# Patient Record
Sex: Female | Born: 1965 | Race: White | Hispanic: Yes | Marital: Married | Smoking: Never smoker
Health system: Southern US, Community
[De-identification: ages and names within clinical notes are randomized; demographics above are authoritative.]

## PROBLEM LIST (undated history)

## (undated) DIAGNOSIS — M1990 Unspecified osteoarthritis, unspecified site: Secondary | ICD-10-CM

## (undated) DIAGNOSIS — M069 Rheumatoid arthritis, unspecified: Secondary | ICD-10-CM

## (undated) HISTORY — DX: Unspecified osteoarthritis, unspecified site: M19.90

## (undated) HISTORY — DX: Rheumatoid arthritis, unspecified: M06.9

## (undated) HISTORY — PX: ILEOSTOMY: SHX1783

---

## 2005-01-07 HISTORY — PX: CHOLECYSTECTOMY: SHX55

## 2012-08-09 HISTORY — PX: BOWEL RESECTION: SHX1257

## 2016-02-15 DIAGNOSIS — M069 Rheumatoid arthritis, unspecified: Secondary | ICD-10-CM | POA: Insufficient documentation

## 2016-02-15 DIAGNOSIS — R5381 Other malaise: Secondary | ICD-10-CM | POA: Insufficient documentation

## 2016-02-15 DIAGNOSIS — D72829 Elevated white blood cell count, unspecified: Secondary | ICD-10-CM | POA: Insufficient documentation

## 2016-02-15 DIAGNOSIS — K5790 Diverticulosis of intestine, part unspecified, without perforation or abscess without bleeding: Secondary | ICD-10-CM | POA: Insufficient documentation

## 2016-09-21 DIAGNOSIS — M1711 Unilateral primary osteoarthritis, right knee: Secondary | ICD-10-CM | POA: Insufficient documentation

## 2018-01-17 DIAGNOSIS — K649 Unspecified hemorrhoids: Secondary | ICD-10-CM | POA: Insufficient documentation

## 2018-01-17 DIAGNOSIS — Z6841 Body Mass Index (BMI) 40.0 and over, adult: Secondary | ICD-10-CM | POA: Insufficient documentation

## 2021-03-10 DIAGNOSIS — Z1211 Encounter for screening for malignant neoplasm of colon: Secondary | ICD-10-CM | POA: Insufficient documentation

## 2021-03-31 NOTE — Progress Notes (Signed)
Office Visit Note  Patient: Laura Barrett             Date of Birth: 1966-03-24           MRN: [id number]             PCP: Krystal Clark, NP Referring: Rhea Bleacher* Visit Date: 04/08/2021 Occupation: @GUAROCC @  Subjective:  Pain in multiple joints   History of Present Illness: Laura Barrett is a 55 y.o. female seen in consultation per request of her PCP.  According to the patient her symptoms a started in December 2011 with pain in her feet.  She states the pain gradually moved to almost all of her joints and her joints were swollen.  She states she was seen by her PCP and her rheumatoid factor was positive.  She was referred to Dr. January 2012.  Dr. Cardell Peach gave her a steroid shot which helped her a lot and placed her on methotrexate.  She states after that she started seeing Dr. Cardell Peach.  Over the years Dr. Dierdre Forth increased her dose of methotrexate to 8 tablets p.o. weekly.  She states in October 2021 she was experiencing increased discomfort in her knee joints and in her hands.  Dr. November 2021 added Humira to methotrexate.  She states she took the combination for about 5-1/2 months and did not notice any improvement.  Humira was discontinued in March 2022.  She continues to have pain and discomfort in her bilateral hands she has noticed more knots on her PIP and DIP joints palpation.  She is also experiencing pain and discomfort in her knee joints.  Been seeing Dr. April 2022 for her osteoarthritis in her knees for the last few years.  She has had cortisone injections in the last worth the Visco supplement injections in July 2021.  She states that after her Synvisc injection to her knee joints she had reaction and her left knee joint stayed swollen for a while.  She also went for a second opinion to [facility].  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  She continues to have some discomfort in her knee joints.  She states in May 1 day after sun exposure she  developed a rash in her neck and on her arms and legs.  She states the rash faded away after that.  Although she has had 2 recurrences of the rash since then.  Her sister has rheumatoid arthritis.  She is gravida 0, para 0.   Activities of Daily Living:  Patient reports morning stiffness for 2-3 hours.   Patient Reports nocturnal pain.  Difficulty dressing/grooming: Reports Difficulty climbing stairs: Reports Difficulty getting out of chair: Reports Difficulty using hands for taps, buttons, cutlery, and/or writing: Reports  Review of Systems  Constitutional:  Positive for fatigue.  HENT:  Negative for mouth sores, mouth dryness and nose dryness.   Eyes:  Positive for itching and dryness. Negative for pain.  Respiratory:  Negative for shortness of breath and difficulty breathing.   Cardiovascular:  Negative for chest pain and palpitations.  Gastrointestinal:  Negative for blood in stool, constipation and diarrhea.  Endocrine: Positive for increased urination.  Genitourinary:  Negative for difficulty urinating.  Musculoskeletal:  Positive for joint pain, joint pain, joint swelling and morning stiffness. Negative for myalgias, muscle tenderness and myalgias.  Skin:  Positive for rash. Negative for color change and sensitivity to sunlight.  Allergic/Immunologic: Negative for susceptible to infections.  Neurological:  Positive for numbness. Negative for dizziness,  headaches, memory loss and weakness.  Hematological:  Positive for bruising/bleeding tendency. Negative for swollen glands.  Psychiatric/Behavioral:  Negative for confusion.    PMFS History:  Patient Active Problem List   Diagnosis Date Noted   Urge incontinence 04/08/2021   Primary insomnia 04/08/2021   Prediabetes 04/08/2021   Acquired hypothyroidism 04/08/2021   History of diverticulosis 04/08/2021   Essential hypertension 04/08/2021   Primary osteoarthritis of both knees 04/08/2021   High risk medication use 04/08/2021     History reviewed. No pertinent past medical history.  Family History  Problem Relation Age of Onset   Hypertension Mother    Stroke Mother    Hypertension Father    Rheum arthritis Sister    Past Surgical History:  Procedure Laterality Date   BOWEL RESECTION  08/2012   CHOLECYSTECTOMY  01/2005   ILEOSTOMY     & reversal   Social History   Social History Narrative   Not on file   Immunization History  Administered Date(s) Administered   PFIZER(Purple Top)SARS-COV-2 Vaccination 12/29/2019, 01/26/2020, 06/25/2020, 02/04/2021     Objective: Vital Signs: BP (!) 136/97 (BP Location: Left Arm, Patient Position: Sitting, Cuff Size: Normal)   Pulse 78   Resp 16   Ht 5\' 2"  (1.575 m)   Wt 231 lb 6.4 oz (105 kg)   LMP  (LMP Unknown)   BMI 42.32 kg/m    Physical Exam Vitals and nursing note reviewed.  Constitutional:      Appearance: She is well-developed.  HENT:     Head: Normocephalic and atraumatic.  Eyes:     Conjunctiva/sclera: Conjunctivae normal.  Cardiovascular:     Rate and Rhythm: Normal rate and regular rhythm.     Heart sounds: Normal heart sounds.  Pulmonary:     Effort: Pulmonary effort is normal.     Breath sounds: Normal breath sounds.  Abdominal:     General: Bowel sounds are normal.     Palpations: Abdomen is soft.  Musculoskeletal:     Cervical back: Normal range of motion.  Lymphadenopathy:     Cervical: No cervical adenopathy.  Skin:    General: Skin is warm and dry.     Capillary Refill: Capillary refill takes less than 2 seconds.  Neurological:     Mental Status: She is alert and oriented to person, place, and time.  Psychiatric:        Behavior: Behavior normal.     Musculoskeletal Exam: C-spine was in good range of motion.  Shoulder joints, elbow joints, wrist joints, MCPs PIPs and DIPs with good range of motion.  She had tenderness over right lateral epicondyle region consistent with lateral epicondylitis.  Hip joints, knee joints,  ankles, MTPs PIPs and DIPs with good range of motion with no synovitis.  CDAI Exam: CDAI Score: 4.5  Patient Global: 4 mm; Provider Global: 1 mm Swollen: 0 ; Tender: 4  Joint Exam 04/08/2021      Right  Left  PIP 3   Tender     PIP 4   Tender     Knee   Tender   Tender     Investigation: No additional findings.  Imaging: XR Foot 2 Views Left  Result Date: 04/08/2021 PIP and DIP narrowing was noted.  No MTP, intertarsal or tibiotalar joint space narrowing was noted.  Inferior calcaneal spur was noted. Impression: These findings are consistent with osteoarthritis of the foot.  XR Foot 2 Views Right  Result Date: 04/08/2021 No MTP, PIP  or DIP narrowing was noted.  No intertarsal, tibiotalar or subtalar joint space narrowing was noted.  Inferior calcaneal spur was noted. Impression: These findings are consistent with mild osteoarthritis of the foot.  XR Hand 2 View Left  Result Date: 04/08/2021 No MCP, PIP or DIP narrowing was noted.  No CMC, intercarpal or radiocarpal joint space narrowing was noted.  No erosive changes were noted. Impression: Unremarkable x-ray of the hand.  XR Hand 2 View Right  Result Date: 04/08/2021 No MCP, PIP or DIP narrowing was noted.  No CMC, intercarpal or radiocarpal joint space narrowing was noted.  No erosive changes were noted. Impression: Unremarkable x-ray of the hand.  XR KNEE 3 VIEW LEFT  Result Date: 04/08/2021 No medial or lateral compartment narrowing was noted.  No patellofemoral narrowing was noted.  No chondrocalcinosis was noted. Impression: Unremarkable x-ray of the knee joint.  XR KNEE 3 VIEW RIGHT  Result Date: 04/08/2021 Mild medial compartment narrowing was noted.  Mild patellofemoral narrowing was noted.  No chondrocalcinosis was noted. Impression: These findings are consistent with mild osteoarthritis and mild chondromalacia patella.   Recent Labs: No results found for: WBC, HGB, PLT, NA, K, CL, CO2, GLUCOSE, BUN, CREATININE,  BILITOT, ALKPHOS, AST, ALT, PROT, ALBUMIN, CALCIUM, GFRAA, QFTBGOLD, QFTBGOLDPLUS  March 10, 2021 uric acid 4.7, CMP normal, TSH normal, LDL 84, CBC normal, hemoglobin A1c 6.0   Speciality Comments: No specialty comments available.  Procedures:  No procedures performed Allergies: Ramipril   Assessment / Plan:     Visit Diagnoses: Rheumatoid arthritis with rheumatoid factor of multiple sites without organ or systems involvement [facility]) -patient was diagnosed with rheumatoid arthritis in 2011 by Dr. Cardell Peach.  She was under care of Dr. Dierdre Forth after that until recently.  She has been on methotrexate 8 tablets p.o. weekly for many years.  She was given Humira for 5 months and was discontinued in March as she did not notice any improvement on the medication.  She continues to have some pain and discomfort in her joints.  No synovitis was noted on the examination today.  I will obtain labs and x-rays today.  Plan: Sedimentation rate, Rheumatoid factor, Cyclic citrul peptide antibody, IgG, ANA  High risk medication use - Methotrexate 8 tablets by mouth once weekly for and folic acid 1 mg 2 tablets p.o. daily.  She had recent CBC and CMP which was reviewed.  I will obtain additional labs today.- Plan: DG Chest 2 View, CBC with Differential/Platelet, COMPLETE METABOLIC PANEL WITH GFR, Hepatitis B core antibody, IgM, Hepatitis B surface antigen, Hepatitis C antibody, QuantiFERON-TB Gold Plus, Serum protein electrophoresis with reflex, IgG, IgA, IgM, HIV Antibody (routine testing w rflx).  She has been advised to stop methotrexate if she develops an infection and restart methotrexate once the infection resolves.  She is fully vaccinated against COVID-19.  Recommendation other immunizations were also placed in the AVS.  Pain in both hands -she complains of pain and discomfort in her bilateral hands.  No synovitis was noted.  I gave her a handout on hand exercises.  I believe some of the discomfort is coming from  underlying osteoarthritis.  Plan: XR Hand 2 View Right, XR Hand 2 View Left.  X-ray of bilateral hands were unremarkable.  Lateral epicondylitis, right elbow-she has symptoms of right lateral epicondylitis.  Have given her a handout on exercises.  Use of topical Voltaren gel was also discussed.  A prescription for tennis elbow brace was given.  Chronic pain of both  knees -she complains of pain and discomfort in her bilateral knee joints.  She has been under care of Dr. Dion SaucierLandau.  She has had cortisone and Visco supplement injections in the past.  She believes she had a reaction to Synvisc injection in the past.  No warmth swelling or effusion was noted on the examination today.  Have given her a handout on knee joint exercises.  Plan: XR KNEE 3 VIEW RIGHT, XR KNEE 3 VIEW LEFT.  Primary osteoarthritis of both knees  Essential hypertension-blood pressure was elevated today.  Have advised her to monitor blood pressure closely and follow-up with her PCP.  Increased risk of heart disease with rheumatoid arthritis was emphasized.  A handout was placed in the AVS.  Pain in both feet -she complains of discomfort in her feet.  No synovitis was noted.  Plan: XR Foot 2 Views Right, XR Foot 2 Views Left x-ray showed early osteoarthritic changes and bilateral calcaneal spurs.  Other medical problems are listed as follows:  History of diverticulosis  Acquired hypothyroidism  Prediabetes  Primary insomnia  Urge incontinence  Family history of rheumatoid arthritis - Sister  Other fatigue - Plan: CK  Orders: Orders Placed This Encounter  Procedures   XR Hand 2 View Right   XR Hand 2 View Left   XR KNEE 3 VIEW RIGHT   XR KNEE 3 VIEW LEFT   XR Foot 2 Views Right   XR Foot 2 Views Left   DG Chest 2 View   CBC with Differential/Platelet   COMPLETE METABOLIC PANEL WITH GFR   Sedimentation rate   CK   Rheumatoid factor   Cyclic citrul peptide antibody, IgG   ANA   Hepatitis B core antibody, IgM    Hepatitis B surface antigen   Hepatitis C antibody   QuantiFERON-TB Gold Plus   Serum protein electrophoresis with reflex   IgG, IgA, IgM   HIV Antibody (routine testing w rflx)    No orders of the defined types were placed in this encounter.    Follow-Up Instructions: Return for Rheumatoid arthritis, Osteoarthritis.   Pollyann SavoyShaili Lilac Hoff, MD  Note - This record has been created using [employer] software.  Chart creation errors have been sought, but may not always  have been located. Such creation errors do not reflect on  the standard of medical care.

## 2021-04-08 ENCOUNTER — Ambulatory Visit: Payer: Self-pay

## 2021-04-08 ENCOUNTER — Encounter: Payer: Self-pay | Admitting: Rheumatology

## 2021-04-08 ENCOUNTER — Ambulatory Visit (INDEPENDENT_AMBULATORY_CARE_PROVIDER_SITE_OTHER): Payer: Managed Care, Other (non HMO) | Admitting: Rheumatology

## 2021-04-08 ENCOUNTER — Telehealth: Payer: Self-pay

## 2021-04-08 ENCOUNTER — Ambulatory Visit (HOSPITAL_COMMUNITY)
Admission: RE | Admit: 2021-04-08 | Discharge: 2021-04-08 | Disposition: A | Discharge status: Home / Self Care | Payer: Managed Care, Other (non HMO) | Source: Ambulatory Visit | Attending: Rheumatology | Admitting: Rheumatology

## 2021-04-08 ENCOUNTER — Other Ambulatory Visit: Payer: Self-pay

## 2021-04-08 ENCOUNTER — Encounter (HOSPITAL_COMMUNITY): Payer: Self-pay

## 2021-04-08 VITALS — BP 136/97 | HR 78 | Resp 16 | Ht 62.0 in | Wt 231.4 lb

## 2021-04-08 DIAGNOSIS — M79671 Pain in right foot: Secondary | ICD-10-CM

## 2021-04-08 DIAGNOSIS — I10 Essential (primary) hypertension: Secondary | ICD-10-CM

## 2021-04-08 DIAGNOSIS — R7303 Prediabetes: Secondary | ICD-10-CM

## 2021-04-08 DIAGNOSIS — M25561 Pain in right knee: Secondary | ICD-10-CM | POA: Diagnosis not present

## 2021-04-08 DIAGNOSIS — M25562 Pain in left knee: Secondary | ICD-10-CM

## 2021-04-08 DIAGNOSIS — R5383 Other fatigue: Secondary | ICD-10-CM

## 2021-04-08 DIAGNOSIS — M79641 Pain in right hand: Secondary | ICD-10-CM | POA: Diagnosis not present

## 2021-04-08 DIAGNOSIS — Z8261 Family history of arthritis: Secondary | ICD-10-CM

## 2021-04-08 DIAGNOSIS — M0579 Rheumatoid arthritis with rheumatoid factor of multiple sites without organ or systems involvement: Secondary | ICD-10-CM | POA: Diagnosis not present

## 2021-04-08 DIAGNOSIS — G8929 Other chronic pain: Secondary | ICD-10-CM | POA: Diagnosis not present

## 2021-04-08 DIAGNOSIS — E039 Hypothyroidism, unspecified: Secondary | ICD-10-CM | POA: Insufficient documentation

## 2021-04-08 DIAGNOSIS — Z8719 Personal history of other diseases of the digestive system: Secondary | ICD-10-CM

## 2021-04-08 DIAGNOSIS — F5101 Primary insomnia: Secondary | ICD-10-CM | POA: Insufficient documentation

## 2021-04-08 DIAGNOSIS — M79642 Pain in left hand: Secondary | ICD-10-CM | POA: Diagnosis not present

## 2021-04-08 DIAGNOSIS — Z79899 Other long term (current) drug therapy: Secondary | ICD-10-CM | POA: Diagnosis not present

## 2021-04-08 DIAGNOSIS — M1711 Unilateral primary osteoarthritis, right knee: Secondary | ICD-10-CM

## 2021-04-08 DIAGNOSIS — M79672 Pain in left foot: Secondary | ICD-10-CM

## 2021-04-08 DIAGNOSIS — N3941 Urge incontinence: Secondary | ICD-10-CM

## 2021-04-08 DIAGNOSIS — M7711 Lateral epicondylitis, right elbow: Secondary | ICD-10-CM | POA: Diagnosis not present

## 2021-04-08 DIAGNOSIS — M170 Bilateral primary osteoarthritis of knee: Secondary | ICD-10-CM

## 2021-04-08 NOTE — Patient Instructions (Addendum)
Methotrexate Tablets What is this medication? METHOTREXATE (METH oh TREX ate) treats inflammatory conditions such as arthritis and psoriasis. It works by decreasing inflammation, which can reduce pain and prevent long-term injury to the joints and skin. It may also be used to treat some types of cancer. It works by slowing down the growth of cancercells. This medicine may be used for other purposes; ask your health care provider orpharmacist if you have questions. COMMON BRAND NAME(S): Rheumatrex, Trexall What should I tell my care team before I take this medication? They need to know if you have any of these conditions: Fluid in the stomach area or lungs If you often drink alcohol Infection or immune system problems Kidney disease or on hemodialysis Liver disease Low blood counts, like low white cell, platelet, or red cell counts Lung disease Radiation therapy Stomach ulcers Ulcerative colitis An unusual or allergic reaction to methotrexate, other medications, foods, dyes, or preservatives Pregnant or trying to get pregnant Breast-feeding How should I use this medication? Take this medication by mouth with a glass of water. Follow the directions on the prescription label. Take your medication at regular intervals. Do not take it more often than directed. Do not stop taking except on your care team'sadvice. Make sure you know why you are taking this medication and how often you should take it. If this medication is used for a condition that is not cancer, like arthritis or psoriasis, it should be taken weekly, NOT daily. Taking thismedication more often than directed can cause serious side effects, even death. Talk to your care team about safe handling and disposal of this medication. Youmay need to take special precautions. Talk to your care team about the use of this medication in children. While thismedication may be prescribed for selected conditions, precautions do apply. Overdosage: If  you think you have taken too much of this medicine contact apoison control center or emergency room at once. NOTE: This medicine is only for you. Do not share this medicine with others. What if I miss a dose? If you miss a dose, talk with your care team. Do not take double or extra doses. What may interact with this medication? Do not take this medication with any of the following: Acitretin This medication may also interact with the following: Aspirin and aspirin-like medications including salicylates Azathioprine Certain antibiotics like penicillins, tetracycline, and chloramphenicol Certain medications that treat or prevent blood clots like warfarin, apixaban, dabigatran, and rivaroxaban Certain medications for stomach problems like esomeprazole, omeprazole, pantoprazole Cyclosporine Dapsone Diuretics Gold Hydroxychloroquine Live virus vaccines Medications for infection like acyclovir, adefovir, amphotericin B, bacitracin, cidofovir, foscarnet, ganciclovir, gentamicin, pentamidine, vancomycin Mercaptopurine NSAIDs, medications for pain and inflammation, like ibuprofen or naproxen Other cytotoxic agents Pamidronate Pemetrexed Penicillamine Phenylbutazone Phenytoin Probenecid Pyrimethamine Retinoids such as isotretinoin and tretinoin Steroid medications like prednisone or cortisone Sulfonamides like sulfasalazine and trimethoprim/sulfamethoxazole Theophylline Zoledronic acid This list may not describe all possible interactions. Give your health care provider a list of all the medicines, herbs, non-prescription drugs, or dietary supplements you use. Also tell them if you smoke, drink alcohol, or use illegaldrugs. Some items may interact with your medicine. What should I watch for while using this medication? Avoid alcoholic drinks. This medication can make you more sensitive to the sun. Keep out of the sun. If you cannot avoid being in the sun, wear protective clothing and use  sunscreen.Do not use sun lamps or tanning beds/booths. You may need blood work done while you are taking this medication.  Call your care team for advice if you get a fever, chills or sore throat, or other symptoms of a cold or flu. Do not treat yourself. This medication decreases your body's ability to fight infections. Try to avoid being aroundpeople who are sick. This medication may increase your risk to bruise or bleed. Call your care teamif you notice any unusual bleeding. Be careful brushing or flossing your teeth or using a toothpick because you may get an infection or bleed more easily. If you have any dental work done, Estate agent you are receiving this medication. Check with your care team if you get an attack of severe diarrhea, nausea and vomiting, or if you sweat a lot. The loss of too much body fluid can make itdangerous for you to take this medication. Talk to your care team about your risk of cancer. You may be more at risk forcertain types of cancers if you take this medication. Do not become pregnant while taking this medication or for 6 months after stopping it. Women should inform their care team if they wish to become pregnant or think they might be pregnant. Men should not father a child while taking this medication and for 3 months after stopping it. There is potential for serious harm to an unborn child. Talk to your care team for more information. Do not breast-feed an infant while taking this medication or for 1week after stopping it. This medication may make it more difficult to get pregnant or father a child.Talk to your care team if you are concerned about your fertility. What side effects may I notice from receiving this medication? Side effects that you should report to your care team as soon as possible: Allergic reactions-skin rash, itching, hives, swelling of the face, lips, tongue, or throat Blood clot-pain, swelling, or warmth in the leg, shortness of breath, chest  pain Dry cough, shortness of breath or trouble breathing Infection-fever, chills, cough, sore throat, wounds that don't heal, pain or trouble when passing urine, general feeling of discomfort or being unwell Kidney injury-decrease in the amount of urine, swelling of the ankles, hands, or feet Liver injury-right upper belly pain, loss of appetite, nausea, light-colored stool, dark yellow or brown urine, yellowing of the skin or eyes, unusual weakness or fatigue Low red blood cell count-unusual weakness or fatigue, dizziness, headache, trouble breathing Redness, blistering, peeling, or loosening of the skin, including inside the mouth Seizures Unusual bruising or bleeding Side effects that usually do not require medical attention (report to your careteam if they continue or are bothersome): Diarrhea Dizziness Hair loss Nausea Pain, redness, or swelling with sores inside the mouth or throat Vomiting This list may not describe all possible side effects. Call your doctor for medical advice about side effects. You may report side effects to FDA at1-800-FDA-1088. Where should I keep my medication? Keep out of the reach of children and pets. Store at room temperature between 20 and 25 degrees C (68 and 77 degrees F).Protect from light. Get rid of any unused medication after the expiration date. Talk to your care team about how to dispose of unused medication. Specialdirections may apply. NOTE: This sheet is a summary. It may not cover all possible information. If you have questions about this medicine, talk to your doctor, pharmacist, orhealth care provider.  2022 Elsevier/Gold Standard (2020-11-01 15:55:45)   Standing Labs We placed an order today for your standing lab work.   Please have your standing labs drawn in September and every 3 months  If possible, please have your labs drawn 2 weeks prior to your appointment so that the provider can discuss your results at your  appointment.  Please note that you may see your imaging and lab results in MyChart before we have reviewed them. We may be awaiting multiple results to interpret others before contacting you. Please allow our office up to 72 hours to thoroughly review all of the results before contacting the office for clarification of your results.  We have open lab daily: Monday through Thursday from 1:30-4:30 PM and Friday from 1:30-4:00 PM at the office of Dr. Pollyann Savoy, [facility].   Please be advised, all patients with office appointments requiring lab work will take precedent over walk-in lab work.  If possible, please come for your lab work on Monday and Friday afternoons, as you may experience shorter wait times. The office is located at [address], [place], [place] [address] No appointment is necessary.   Labs are drawn by [facility]. Please bring your co-pay at the time of your lab draw.  You may receive a bill from [facility] for your lab work. Vaccines You are taking a medication(s) that can suppress your immune system.  The following immunizations are recommended: Flu annually Covid-19  Td/Tdap (tetanus, diphtheria, pertussis) every 10 years Pneumonia (Prevnar 15 then Pneumovax 23 at least 1 year apart.  Alternatively, can take Prevnar 20 without needing additional dose) Shingrix (after age 106): 2 doses from 4 weeks to 6 months apart  Please check with your PCP to make sure you are up to date.  If you test POSITIVE for COVID19 and have MILD to MODERATE symptoms: First, call your PCP if you would like to receive COVID19 treatment AND Hold your medications during the infection and for at least 1 week after your symptoms have resolved: Injectable medication (Benlysta, Cimzia, Cosentyx, Enbrel, Humira, Orencia, Remicade, Simponi, Stelara, Taltz, Tremfya) Methotrexate Leflunomide (Arava) Mycophenolate (Cellcept) Harriette Ohara, Olumiant, or Rinvoq If you take Actemra or  Kevzara, you DO NOT need to hold these for COVID19 infection.  If you test POSITIVE for COVID19 and have NO symptoms: First, call your PCP if you would like to receive COVID19 treatment AND Hold your medications for at least 10 days after the day that you tested positive Injectable medication (Benlysta, Cimzia, Cosentyx, Enbrel, Humira, Orencia, Remicade, Simponi, Stelara, Taltz, Tremfya) Methotrexate Leflunomide (Arava) Mycophenolate (Cellcept) Harriette Ohara, Olumiant, or Rinvoq If you take Actemra or Kevzara, you DO NOT need to hold these for COVID19 infection.  If you have signs or symptoms of an infection or start antibiotics: First, call your PCP for workup of your infection. Hold your medication through the infection, until you complete your antibiotics, and until symptoms resolve if you take the following: Injectable medication (Actemra, Benlysta, Cimzia, Cosentyx, Enbrel, Humira, Kevzara, Orencia, Remicade, Simponi, Stelara, Taltz, Tremfya) Methotrexate Leflunomide (Arava) Mycophenolate (Cellcept) Harriette Ohara, Olumiant, or Rinvoq  Heart Disease Prevention   Your inflammatory disease increases your risk of heart disease which includes heart attack, stroke, atrial fibrillation (irregular heartbeats), high blood pressure, heart failure and atherosclerosis (plaque in the arteries).  It is important to reduce your risk by:   Keep blood pressure, cholesterol, and blood sugar at healthy levels   Smoking Cessation   Maintain a healthy weight  BMI 20-25   Eat a healthy diet  Plenty of fresh fruit, vegetables, and whole grains  Limit saturated fats, foods high in sodium, and added sugars  DASH and Mediterranean diet   Increase  physical activity  Recommend moderate physically activity for 150 minutes per week/ 30 minutes a day for five days a week These can be broken up into three separate ten-minute sessions during the day.   Reduce Stress  Meditation, slow breathing exercises, yoga,  coloring books  Dental visits twice a year   Journal for Nurse Practitioners, 15(4), [phone]. Retrieved July 15, 2018 from http://clinicalkey.com/nursing">  Knee Exercises Ask your health care provider which exercises are safe for you. Do exercises exactly as told by your health care provider and adjust them as directed. It is normal to feel mild stretching, pulling, tightness, or discomfort as you do these exercises. Stop right away if you feel sudden pain or your pain gets worse. Do not begin these exercises until told by your health care provider. Stretching and range-of-motion exercises These exercises warm up your muscles and joints and improve the movement and flexibility of your knee. These exercises also help to relieve pain andswelling. Knee extension, prone Lie on your abdomen (prone position) on a bed. Place your left / right knee just beyond the edge of the surface so your knee is not on the bed. You can put a towel under your left / right thigh just above your kneecap for comfort. Relax your leg muscles and allow gravity to straighten your knee (extension). You should feel a stretch behind your left / right knee. Hold this position for __________ seconds. Scoot up so your knee is supported between repetitions. Repeat __________ times. Complete this exercise __________ times a day. Knee flexion, active  Lie on your back with both legs straight. If this causes back discomfort, bend your left / right knee so your foot is flat on the floor. Slowly slide your left / right heel back toward your buttocks. Stop when you feel a gentle stretch in the front of your knee or thigh (flexion). Hold this position for __________ seconds. Slowly slide your left / right heel back to the starting position. Repeat __________ times. Complete this exercise __________ times a day. Quadriceps stretch, prone  Lie on your abdomen on a firm surface, such as a bed or padded floor. Bend your left / right  knee and hold your ankle. If you cannot reach your ankle or pant leg, loop a belt around your foot and grab the belt instead. Gently pull your heel toward your buttocks. Your knee should not slide out to the side. You should feel a stretch in the front of your thigh and knee (quadriceps). Hold this position for __________ seconds. Repeat __________ times. Complete this exercise __________ times a day. Hamstring, supine Lie on your back (supine position). Loop a belt or towel over the ball of your left / right foot. The ball of your foot is on the walking surface, right under your toes. Straighten your left / right knee and slowly pull on the belt to raise your leg until you feel a gentle stretch behind your knee (hamstring). Do not let your knee bend while you do this. Keep your other leg flat on the floor. Hold this position for __________ seconds. Repeat __________ times. Complete this exercise __________ times a day. Strengthening exercises These exercises build strength and endurance in your knee. Endurance is theability to use your muscles for a long time, even after they get tired. Quadriceps, isometric This exercise stretches the muscles in front of your thigh (quadriceps) without moving your knee joint (isometric). Lie on your back with your left / right leg extended and  your other knee bent. Put a rolled towel or small pillow under your knee if told by your health care provider. Slowly tense the muscles in the front of your left / right thigh. You should see your kneecap slide up toward your hip or see increased dimpling just above the knee. This motion will push the back of the knee toward the floor. For __________ seconds, hold the muscle as tight as you can without increasing your pain. Relax the muscles slowly and completely. Repeat __________ times. Complete this exercise __________ times a day. Straight leg raises This exercise stretches the muscles in front of your thigh  (quadriceps) and the muscles that move your hips (hip flexors). Lie on your back with your left / right leg extended and your other knee bent. Tense the muscles in the front of your left / right thigh. You should see your kneecap slide up or see increased dimpling just above the knee. Your thigh may even shake a bit. Keep these muscles tight as you raise your leg 4-6 inches (10-15 cm) off the floor. Do not let your knee bend. Hold this position for __________ seconds. Keep these muscles tense as you lower your leg. Relax your muscles slowly and completely after each repetition. Repeat __________ times. Complete this exercise __________ times a day. Hamstring, isometric Lie on your back on a firm surface. Bend your left / right knee about __________ degrees. Dig your left / right heel into the surface as if you are trying to pull it toward your buttocks. Tighten the muscles in the back of your thighs (hamstring) to "dig" as hard as you can without increasing any pain. Hold this position for __________ seconds. Release the tension gradually and allow your muscles to relax completely for __________ seconds after each repetition. Repeat __________ times. Complete this exercise __________ times a day. Hamstring curls If told by your health care provider, do this exercise while wearing ankle weights. Begin with __________ lb weights. Then increase the weight by 1 lb (0.5 kg) increments. Do not wear ankle weights that are more than __________ lb. Lie on your abdomen with your legs straight. Bend your left / right knee as far as you can without feeling pain. Keep your hips flat against the floor. Hold this position for __________ seconds. Slowly lower your leg to the starting position. Repeat __________ times. Complete this exercise __________ times a day. Squats This exercise strengthens the muscles in front of your thigh and knee (quadriceps). Stand in front of a table, with your feet and knees  pointing straight ahead. You may rest your hands on the table for balance but not for support. Slowly bend your knees and lower your hips like you are going to sit in a chair. Keep your weight over your heels, not over your toes. Keep your lower legs upright so they are parallel with the table legs. Do not let your hips go lower than your knees. Do not bend lower than told by your health care provider. If your knee pain increases, do not bend as low. Hold the squat position for __________ seconds. Slowly push with your legs to return to standing. Do not use your hands to pull yourself to standing. Repeat __________ times. Complete this exercise __________ times a day. Wall slides This exercise strengthens the muscles in front of your thigh and knee (quadriceps). Lean your back against a smooth wall or door, and walk your feet out 18-24 inches (46-61 cm) from it. Place your  feet hip-width apart. Slowly slide down the wall or door until your knees bend __________ degrees. Keep your knees over your heels, not over your toes. Keep your knees in line with your hips. Hold this position for __________ seconds. Repeat __________ times. Complete this exercise __________ times a day. Straight leg raises This exercise strengthens the muscles that rotate the leg at the hip and move it away from your body (hip abductors). Lie on your side with your left / right leg in the top position. Lie so your head, shoulder, knee, and hip line up. You may bend your bottom knee to help you keep your balance. Roll your hips slightly forward so your hips are stacked directly over each other and your left / right knee is facing forward. Leading with your heel, lift your top leg 4-6 inches (10-15 cm). You should feel the muscles in your outer hip lifting. Do not let your foot drift forward. Do not let your knee roll toward the ceiling. Hold this position for __________ seconds. Slowly return your leg to the starting  position. Let your muscles relax completely after each repetition. Repeat __________ times. Complete this exercise __________ times a day. Straight leg raises This exercise stretches the muscles that move your hips away from the front of the pelvis (hip extensors). Lie on your abdomen on a firm surface. You can put a pillow under your hips if that is more comfortable. Tense the muscles in your buttocks and lift your left / right leg about 4-6 inches (10-15 cm). Keep your knee straight as you lift your leg. Hold this position for __________ seconds. Slowly lower your leg to the starting position. Let your leg relax completely after each repetition. Repeat __________ times. Complete this exercise __________ times a day. This information is not intended to replace advice given to you by your health care provider. Make sure you discuss any questions you have with your healthcare provider. Document Revised: 07/16/2018 Document Reviewed: 07/16/2018 Elsevier Patient Education  2022 [employer].   If you wish to have your labs drawn at another location, please call the office 24 hours in advance to send orders.  If you have any questions regarding directions or hours of operation,  please call [phone].   As a reminder, please drink plenty of water prior to coming for your lab work. Thanks!  Elbow and Forearm Exercises Ask your health care provider which exercises are safe for you. Do exercises exactly as told by your health care provider and adjust them as directed. It is normal to feel mild stretching, pulling, tightness, or discomfort as you do these exercises. Stop right away if you feel sudden pain or your pain gets worse. Do not begin these exercises until told by your health care provider. Range-of-motion exercises These exercises warm up your muscles and joints and improve the movement and flexibility of your injured elbow and forearm. The exercises also help to relieve pain, numbness,  and tingling. These exercises are done using the muscles in your injured elbow and forearm (active). Elbow flexion, active Hold your left / right arm at your side, and bend your elbow (flexion) as far as you can using only your arm muscles. Hold this position for __________ seconds. Slowly return to the starting position. Repeat __________ times. Complete this exercise __________ times a day. Elbow extension, active Hold your left / right arm at your side, and straighten your elbow (extension) as much as you can using only your arm muscles. Hold  this position for __________ seconds. Slowly return to the starting position. Repeat __________ times. Complete this exercise __________ times a day. Active forearm rotation, supination This is an exercise in which you turn (rotate) your forearm palm up (supination). Stand or sit with your elbows at your sides. Bend your left / right elbow to a 90-degree angle (right angle). Rotate your palm up until you feel a gentle stretch on the inside of your forearm. Hold this position for __________ seconds. Slowly return to the starting position. Repeat __________ times. Complete this exercise __________ times a day. Active forearm rotation, pronation This is an exercise in which you turn (rotate) your forearm palm down (pronation). Stand or sit with your elbows at your sides. Bend your left / right elbow to a 90-degree angle (right angle). Rotate your palm down until you feel a gentle stretch on the top of your forearm. Hold this position for __________ seconds. Slowly return to the starting position. Repeat __________ times. Complete this exercise __________ times a day. Stretching exercises These exercises warm up your muscles and joints and improve the movement and flexibility of your injured elbow and forearm. These exercises also help to relieve pain, numbness, and tingling. These exercises are done using your healthy elbow and forearm to help  stretch the muscles in your injured elbow and forearm (active-assisted). Elbow flexion, active-assisted  Hold your left / right arm at your side, and bend your elbow (flexion) as much as you can using your left / right arm muscles. Use your other hand to bend your left / right elbow farther. To do this, gently push up on your forearm until you feel a gentle stretch on the back of your elbow. Hold this position for __________ seconds. Slowly return to the starting position. Repeat __________ times. Complete this exercise __________ times a day. Elbow extension, active-assisted  Hold your left / right arm at your side, and straighten your elbow (extension) as much as you can using your left / right arm muscles. Use your other hand to straighten the left / right elbow farther. To do this, gently push down on your forearm until you feel a gentle stretch on the inside of your elbow. Hold this position for __________ seconds. Slowly return to the starting position. Repeat __________ times. Complete this exercise __________ times a day. Active-assisted forearm rotation, supination This is an exercise in which you turn (rotate) your forearm palm up (supination). Sit with your left / right elbow bent in a 90-degree angle (right angle) with your forearm resting on a table. Keeping your upper body and shoulder still, rotate your forearm so your palm faces upward. Use your other hand to help rotate your forearm further until you feel a gentle to moderate stretch. Hold this position for __________ seconds. Slowly release the stretch and return to the starting position. Repeat __________ times. Complete this exercise __________ times a day. Active-assisted forearm rotation, pronation This is an exercise in which you turn (rotate) your forearm palm down (pronation). Sit with your left / right elbow bent in a 90-degree angle (right angle) with your forearm resting on a table. Keeping your upper body and  shoulder still, rotate your forearm so your palm faces the tabletop. Use your other hand to help rotate your forearm further until you feel a gentle to moderate stretch. Hold this position for __________ seconds. Slowly release the stretch and return to the starting position. Repeat __________ times. Complete this exercise __________ times a day. Passive elbow  flexion, supine Lie on your back (supine position). Extend your left / right arm up in the air, bracing it with your other hand. Let your left / right hand slowly lower toward your shoulder (passive flexion), while your elbow stays pointed toward the ceiling. You should feel a gentle stretch along the back of your upper arm and elbow. If instructed by your health care provider, you may increase the intensity of your stretch by adding a small wrist weight or hand weight. Hold this position for __________ seconds. Slowly return to the starting position. Repeat __________ times. Complete this exercise __________ times a day. Passive elbow extension, supine  Lie on your back (supine position). Make sure that you are in a comfortable position that lets you relax your arm muscles. Place a folded towel under your left / right upper arm so your elbow and shoulder are at the same height. Straighten your left / right arm so your elbow does not rest on the bed or towel. Let the weight of your hand stretch your elbow (passive extension). Keep your arm and chest muscles relaxed. You should feel a stretch on the inside of your elbow. If told by your health care provider, you may increase the intensity of your stretch by adding a small wrist weight or hand weight. Hold this position for __________ seconds. Slowly release the stretch. Repeat __________ times. Complete this exercise __________ times a day. Strengthening exercises These exercises build strength and endurance in your elbow and forearm. Endurance is the ability to use your muscles for a  long time, even after theyget tired. Elbow flexion, isometric  Stand or sit up straight. Bend your left / right elbow in a 90-degree angle (right angle), and keep your forearm at the height of your waist. Your thumb should be pointed toward the ceiling (neutral forearm). Place your other hand on top of your left / right forearm. Gently push down while you resist with your left / right arm (isometric flexion). Push as hard as you can with both arms without causing any pain or movement at your left / right elbow. Hold this position for __________ seconds. Slowly release the tension in both arms. Let your muscles relax completely before you repeat the exercise. Repeat __________ times. Complete this exercise __________ times a day. Elbow extension, isometric  Stand or sit up straight. Place your left / right arm so your palm faces your abdomen and is at the height of your waist. Place your other hand on the underside of your left / right forearm. Gently push up while you resist with your left / right arm (isometric extension). Push as hard as you can with both arms without causing any pain or movement at your left / right elbow. Hold this position for __________ seconds. Slowly release the tension in both arms. Let your muscles relax completely before you repeat the exercise. Repeat __________ times. Complete this exercise __________ times a day. Elbow flexion with forearm palm up  Sit on a firm chair without armrests, or stand up. Place your left / right arm at your side with your elbow straight and your palm facing forward. Holding a __________weight or gripping a rubber exercise band or tubing, bend your elbow to bring your hand toward your shoulder (flexion). Hold this position for __________ seconds. Slowly return to the starting position. Repeat __________ times. Complete this exercise __________ times a day. Elbow extension, active  Sit on a firm chair without armrests, or stand  up. Hold  a rubber exercise band or tubing in both hands. Keeping your upper arms at your sides, bring both hands up to your left / right shoulder. Keep your left / right hand just below your other hand. Straighten your left / right elbow (extension) while keeping your other arm still. Hold this position for __________ seconds. Control the resistance of the band or tubing as you return to the starting position. Repeat __________ times. Complete this exercise __________ times a day. Forearm rotation, supination  Sit with your left / right forearm supported on a table. Your elbow should be at waist height and bent at a 90-degree angle (right angle). Gently grasp a lightweight hammer. Rest your hand over the edge of the table with your palm facing down. Without moving your left / right elbow, slowly rotate your forearm to turn your palm up toward the ceiling (supination). Hold this position for __________ seconds. Slowly return to the starting position. Repeat __________ times. Complete this exercise __________ times a day. Forearm rotation, pronation  Sit with your left / right forearm supported on a table. Keep your elbow below shoulder height. Gently grasp a lightweight hammer. Rest your hand over the edge of the table with your palm facing up. Without moving your left / right elbow, slowly rotate your forearm to turn your palm down toward the floor (pronation). Hold this position for __________seconds. Slowly return to the starting position. Repeat __________ times. Complete this exercise __________ times a day. This information is not intended to replace advice given to you by your health care provider. Make sure you discuss any questions you have with your healthcare provider. Document Revised: 01/16/2019 Document Reviewed: 10/16/2018 Elsevier Patient Education  2022 [employer] Hand Exercises Hand exercises can be helpful for almost anyone. These exercises can strengthen the hands,  improve flexibility and movement, and increase blood flow to the hands. These results can make work and daily tasks easier. Hand exercises can be especially helpful for people who have joint pain from arthritis or have nerve damage from overuse (carpal tunnel syndrome). These exercises can also help people who have injured a hand. Exercises Most of these hand exercises are gentle stretching and motion exercises. It is usually safe to do them often throughout the day. Warming up your hands before exercise may help to reduce stiffness. You can do this with gentle massage orby placing your hands in warm water for 10-15 minutes. It is normal to feel some stretching, pulling, tightness, or mild discomfort as you begin new exercises. This will gradually improve. Stop an exercise right away if you feel sudden, severe pain or your pain gets worse. Ask your healthcare provider which exercises are best for you. Knuckle bend or "claw" fist Stand or sit with your arm, hand, and all five fingers pointed straight up. Make sure to keep your wrist straight during the exercise. Gently bend your fingers down toward your palm until the tips of your fingers are touching the top of your palm. Keep your big knuckle straight and just bend the small knuckles in your fingers. Hold this position for __________ seconds. Straighten (extend) your fingers back to the starting position. Repeat this exercise 5-10 times with each hand. Full finger fist Stand or sit with your arm, hand, and all five fingers pointed straight up. Make sure to keep your wrist straight during the exercise. Gently bend your fingers into your palm until the tips of your fingers are touching the middle of your palm. Hold this position  for __________ seconds. Extend your fingers back to the starting position, stretching every joint fully. Repeat this exercise 5-10 times with each hand. Straight fist Stand or sit with your arm, hand, and all five fingers  pointed straight up. Make sure to keep your wrist straight during the exercise. Gently bend your fingers at the big knuckle, where your fingers meet your hand, and the middle knuckle. Keep the knuckle at the tips of your fingers straight and try to touch the bottom of your palm. Hold this position for __________ seconds. Extend your fingers back to the starting position, stretching every joint fully. Repeat this exercise 5-10 times with each hand. Tabletop Stand or sit with your arm, hand, and all five fingers pointed straight up. Make sure to keep your wrist straight during the exercise. Gently bend your fingers at the big knuckle, where your fingers meet your hand, as far down as you can while keeping the small knuckles in your fingers straight. Think of forming a tabletop with your fingers. Hold this position for __________ seconds. Extend your fingers back to the starting position, stretching every joint fully. Repeat this exercise 5-10 times with each hand. Finger spread Place your hand flat on a table with your palm facing down. Make sure your wrist stays straight as you do this exercise. Spread your fingers and thumb apart from each other as far as you can until you feel a gentle stretch. Hold this position for __________ seconds. Bring your fingers and thumb tight together again. Hold this position for __________ seconds. Repeat this exercise 5-10 times with each hand. Making circles Stand or sit with your arm, hand, and all five fingers pointed straight up. Make sure to keep your wrist straight during the exercise. Make a circle by touching the tip of your thumb to the tip of your index finger. Hold for __________ seconds. Then open your hand wide. Repeat this motion with your thumb and each finger on your hand. Repeat this exercise 5-10 times with each hand. Thumb motion Sit with your forearm resting on a table and your wrist straight. Your thumb should be facing up toward the  ceiling. Keep your fingers relaxed as you move your thumb. Lift your thumb up as high as you can toward the ceiling. Hold for __________ seconds. Bend your thumb across your palm as far as you can, reaching the tip of your thumb for the small finger (pinkie) side of your palm. Hold for __________ seconds. Repeat this exercise 5-10 times with each hand. Grip strengthening  Hold a stress ball or other soft ball in the middle of your hand. Slowly increase the pressure, squeezing the ball as much as you can without causing pain. Think of bringing the tips of your fingers into the middle of your palm. All of your finger joints should bend when doing this exercise. Hold your squeeze for __________ seconds, then relax. Repeat this exercise 5-10 times with each hand. Contact a health care provider if: Your hand pain or discomfort gets much worse when you do an exercise. Your hand pain or discomfort does not improve within 2 hours after you exercise. If you have any of these problems, stop doing these exercises right away. Do not do them again unless your health care provider says that you can. Get help right away if: You develop sudden, severe hand pain or swelling. If this happens, stop doing these exercises right away. Do not do them again unless your health care provider says that  you can. This information is not intended to replace advice given to you by your health care provider. Make sure you discuss any questions you have with your healthcare provider. Document Revised: 01/16/2019 Document Reviewed: 09/26/2018 Elsevier Patient Education  2022 [employer].

## 2021-04-08 NOTE — Telephone Encounter (Signed)
FYI: Patient was seen as a new patient today. She has transferred care from [facility]. Patient is currently on methotrexate.  Consent obtained and sent to the scan center.

## 2021-04-11 NOTE — Progress Notes (Signed)
An old calcified nodule was noted in the right upper portion of the lung.  No comparison x-rays were available.  I will forward results to her PCP for evaluation.

## 2021-04-12 LAB — PROTEIN ELECTROPHORESIS, SERUM, WITH REFLEX
Albumin ELP: 3.7 g/dL — ABNORMAL LOW (ref 3.8–4.8)
Alpha 1: 0.3 g/dL (ref 0.2–0.3)
Alpha 2: 0.6 g/dL (ref 0.5–0.9)
Beta 2: 0.4 g/dL (ref 0.2–0.5)
Beta Globulin: 0.5 g/dL (ref 0.4–0.6)
Gamma Globulin: 1.1 g/dL (ref 0.8–1.7)
Total Protein: 6.6 g/dL (ref 6.1–8.1)

## 2021-04-12 LAB — QUANTIFERON-TB GOLD PLUS
Mitogen-NIL: >10 IU/mL
NIL: 0.02 IU/mL
QuantiFERON-TB Gold Plus: NEGATIVE
TB1-NIL: 0.02 IU/mL
TB2-NIL: 0.03 IU/mL

## 2021-04-12 LAB — IGG, IGA, IGM
IgG (Immunoglobin G), Serum: 1214 mg/dL (ref 600–1640)
IgM, Serum: 105 mg/dL (ref 50–300)
Immunoglobulin A: 273 mg/dL (ref 47–310)

## 2021-04-12 LAB — HIV ANTIBODY (ROUTINE TESTING W REFLEX): HIV 1&2 Ab, 4th Generation: NONREACTIVE

## 2021-04-12 LAB — CYCLIC CITRUL PEPTIDE ANTIBODY, IGG: Cyclic Citrullin Peptide Ab: >250 UNITS — ABNORMAL HIGH

## 2021-04-12 LAB — SEDIMENTATION RATE: Sed Rate: 6 mm/h (ref 0–30)

## 2021-04-12 LAB — HEPATITIS C ANTIBODY
Hepatitis C Ab: NONREACTIVE
SIGNAL TO CUT-OFF: 0.01 (ref <1.00)

## 2021-04-12 LAB — HEPATITIS B SURFACE ANTIGEN: Hepatitis B Surface Ag: NONREACTIVE

## 2021-04-12 LAB — ANTI-NUCLEAR AB-TITER (ANA TITER): ANA Titer 1: 1:40 {titer} — ABNORMAL HIGH

## 2021-04-12 LAB — HEPATITIS B CORE ANTIBODY, IGM: Hep B C IgM: NONREACTIVE

## 2021-04-12 LAB — RHEUMATOID FACTOR: Rheumatoid fact SerPl-aCnc: 197 IU/mL — ABNORMAL HIGH (ref <14)

## 2021-04-12 LAB — CK: Total CK: 51 U/L (ref 29–143)

## 2021-04-12 LAB — ANA: Anti Nuclear Antibody (ANA): POSITIVE — AB

## 2021-04-12 NOTE — Telephone Encounter (Signed)
Sticky note made in patient's chart 

## 2021-04-12 NOTE — Progress Notes (Signed)
Labs are consistent with rheumatoid arthritis.  I will discuss all the lab results at the follow-up visit.

## 2021-04-24 NOTE — Progress Notes (Signed)
Office Visit Note  Patient: Laura Barrett             Date of Birth: 1965-12-15           MRN: [id number]             PCP: Mayer Camel, NP Referring: Bess Harvest* Visit Date: 05/04/2021 Occupation: '@GUAROCC' @  Subjective:  Medication monitoring   History of Present Illness: Laura Barrett is a 55 y.o. female with history of rheumatoid arthritis and osteoarthritis.  She states she has been tolerating methotrexate well.  She continues to have some stiffness in her joints but no joint swelling.  She states her right elbow has been painful.  She has been using right tennis elbow brace which is helped but her symptoms persist.  She has been also using topical Voltaren gel without much relief.  She has some discomfort in her knee joints but no joint swelling.  Activities of Daily Living:  Patient reports morning stiffness for 2 hours.   Patient Reports nocturnal pain.  Difficulty dressing/grooming: Reports Difficulty climbing stairs: Reports Difficulty getting out of chair: Reports Difficulty using hands for taps, buttons, cutlery, and/or writing: Reports  Review of Systems  Constitutional:  Positive for fatigue.  HENT:  Negative for mouth sores, mouth dryness and nose dryness.   Eyes:  Negative for pain, itching, visual disturbance and dryness.  Respiratory:  Negative for cough, hemoptysis, shortness of breath and difficulty breathing.   Cardiovascular:  Negative for chest pain, palpitations and swelling in legs/feet.  Gastrointestinal:  Negative for abdominal pain, blood in stool, constipation and diarrhea.  Endocrine: Negative for increased urination.  Genitourinary:  Negative for painful urination.  Musculoskeletal:  Positive for joint pain, joint pain, joint swelling, morning stiffness and muscle tenderness. Negative for myalgias, muscle weakness and myalgias.  Skin:  Negative for color change, rash and redness.  Allergic/Immunologic: Negative for  susceptible to infections.  Neurological:  Negative for dizziness, numbness, headaches, memory loss and weakness.  Hematological:  Negative for swollen glands.  Psychiatric/Behavioral:  Positive for sleep disturbance. Negative for confusion.    PMFS History:  Patient Active Problem List   Diagnosis Date Noted   Urge incontinence 04/08/2021   Primary insomnia 04/08/2021   Prediabetes 04/08/2021   Acquired hypothyroidism 04/08/2021   History of diverticulosis 04/08/2021   Essential hypertension 04/08/2021   Primary osteoarthritis of both knees 04/08/2021   High risk medication use 04/08/2021    History reviewed. No pertinent past medical history.  Family History  Problem Relation Age of Onset   Hypertension Mother    Stroke Mother    Hypertension Father    Rheum arthritis Sister    Past Surgical History:  Procedure Laterality Date   BOWEL RESECTION  08/2012   CHOLECYSTECTOMY  01/2005   ILEOSTOMY     & reversal   Social History   Social History Narrative   Not on file   Immunization History  Administered Date(s) Administered   PFIZER(Purple Top)SARS-COV-2 Vaccination 12/29/2019, 01/26/2020, 06/25/2020, 02/04/2021     Objective: Vital Signs: BP 109/76 (BP Location: Left Arm, Patient Position: Sitting, Cuff Size: Large)   Pulse 78   Ht 5' 1.75" (1.568 m)   Wt 231 lb 9.6 oz (105.1 kg)   LMP  (LMP Unknown)   BMI 42.70 kg/m    Physical Exam Vitals and nursing note reviewed.  Constitutional:      Appearance: She is well-developed.  HENT:     Head: Normocephalic  and atraumatic.  Eyes:     Conjunctiva/sclera: Conjunctivae normal.  Cardiovascular:     Rate and Rhythm: Normal rate and regular rhythm.     Heart sounds: Normal heart sounds.  Pulmonary:     Effort: Pulmonary effort is normal.     Breath sounds: Normal breath sounds.  Abdominal:     General: Bowel sounds are normal.     Palpations: Abdomen is soft.  Musculoskeletal:     Cervical back: Normal range  of motion.  Lymphadenopathy:     Cervical: No cervical adenopathy.  Skin:    General: Skin is warm and dry.     Capillary Refill: Capillary refill takes less than 2 seconds.  Neurological:     Mental Status: She is alert and oriented to person, place, and time.  Psychiatric:        Behavior: Behavior normal.     Musculoskeletal Exam: C-spine was in good range of motion.  Shoulder joints, elbow joints, wrist joints, MCPs PIPs and DIPs with good range of motion with no synovitis.  Hip joints, knee joints, ankles, MTPs and PIPs with good range of motion with no synovitis.  She had tenderness on palpation of her right lateral epicondyle region.  CDAI Exam: CDAI Score: 1.6  Patient Global: 3 mm; Provider Global: 3 mm Swollen: 0 ; Tender: 1  Joint Exam 05/04/2021      Right  Left  Elbow   Tender        Investigation: No additional findings.  Imaging: DG Chest 2 View  Result Date: 04/09/2021 CLINICAL DATA:  Immunosuppressive therapy EXAM: CHEST - 2 VIEW COMPARISON:  None FINDINGS: 4 mm calcified right upper lobe pulmonary nodule likely reflecting sequela prior granulomatous disease. No focal consolidation. No pleural effusion or pneumothorax. Heart and mediastinal contours are unremarkable. No acute osseous abnormality. IMPRESSION: No active cardiopulmonary disease. Electronically Signed   By: Kathreen Devoid   On: 04/09/2021 07:30   XR Foot 2 Views Left  Result Date: 04/08/2021 PIP and DIP narrowing was noted.  No MTP, intertarsal or tibiotalar joint space narrowing was noted.  Inferior calcaneal spur was noted. Impression: These findings are consistent with osteoarthritis of the foot.  XR Foot 2 Views Right  Result Date: 04/08/2021 No MTP, PIP or DIP narrowing was noted.  No intertarsal, tibiotalar or subtalar joint space narrowing was noted.  Inferior calcaneal spur was noted. Impression: These findings are consistent with mild osteoarthritis of the foot.  XR Hand 2 View Left  Result  Date: 04/08/2021 No MCP, PIP or DIP narrowing was noted.  No CMC, intercarpal or radiocarpal joint space narrowing was noted.  No erosive changes were noted. Impression: Unremarkable x-ray of the hand.  XR Hand 2 View Right  Result Date: 04/08/2021 No MCP, PIP or DIP narrowing was noted.  No CMC, intercarpal or radiocarpal joint space narrowing was noted.  No erosive changes were noted. Impression: Unremarkable x-ray of the hand.  XR KNEE 3 VIEW LEFT  Result Date: 04/08/2021 No medial or lateral compartment narrowing was noted.  No patellofemoral narrowing was noted.  No chondrocalcinosis was noted. Impression: Unremarkable x-ray of the knee joint.  XR KNEE 3 VIEW RIGHT  Result Date: 04/08/2021 Mild medial compartment narrowing was noted.  Mild patellofemoral narrowing was noted.  No chondrocalcinosis was noted. Impression: These findings are consistent with mild osteoarthritis and mild chondromalacia patella.   Recent Labs: Lab Results  Component Value Date   PROT 6.6 04/08/2021   QFTBGOLDPLUS NEGATIVE  04/08/2021   21st 2002 SPEP normal, immunoglobulins normal, TB Gold negative, hepatitis B-, hepatitis C negative, HIV negative, CK 51, ESR 6, ANA 1: 40NH, RF 197, anti-CCP> 250   Speciality Comments: Humira x 40month-discontinued 03/22 due to inadequate response.  Procedures:  Medium Joint Inj: R lateral epicondyle on 05/04/2021 12:32 PM Indications: pain Details: 27 G 1.5 in posterior Medications: 1 mL lidocaine 1 %; 30 mg triamcinolone acetonide 40 MG/ML Aspirate: 0 mL Outcome: tolerated well, no immediate complications   Allergies: Ramipril   Assessment / Plan:     Visit Diagnoses: Rheumatoid arthritis with rheumatoid factor of multiple sites without organ or systems involvement ([facility] [place] - RF 197, anti-CCP> 250, Dxd in 2011 by Dr. GAbner GreenspanTtd by Dr. BAmil Amenuntil recently.  Methotrexate 8 tabs p.o. qwkx years, HumiraX 552ms.dcd 03/22.  Patient had no synovitis.  She has been doing well on  methotrexate monotherapy.  We will continue current treatment.  High risk medication use - Methotrexate 8 tablets p.o. weekly, folic acid 2 mg p.o. daily.  Labs from June 2022 which included CBC with differential and CMP with GFR were normal.  She has been advised to get labs every 3 months to monitor for drug toxicity.  She is also advised to resume methotrexate once the infection resolves.  Pain in both hands -she has off-and-on stiffness in her hands.  No synovitis was noted.  X-rays of bilateral hands were unremarkable.  Lateral epicondylitis, right elbow - A prescription for Voltaren gel and tennis elbow brace was given at the last visit.  She has not had much improvement in her symptoms.  She requested a cortisone injection.  Indications side effects contraindications were discussed the procedure was performed as described above.  She tolerated the procedure well.  Postprocedure instructions were given.  Advised to continue to do exercises.  Chronic pain of both knees - Noted by Dr. LaMardelle Matte She has had cortisone and Visco supplement injections.  X-ray showed mild osteoarthritis and mild chondromalacia patella.  Pain in both feet - X-ray showed early osteoarthritis and calcaneal spurs.  Essential hypertension-blood pressure is well controlled.  Prediabetes-dietary modifications were discussed.  History of diverticulosis  Acquired hypothyroidism  Urge incontinence  Primary insomnia  Family history of rheumatoid arthritis  Orders: Orders Placed This Encounter  Procedures   Medium Joint Inj: R lateral epicondyle    Meds ordered this encounter  Medications   methotrexate (RHEUMATREX) 2.5 MG tablet    Sig: TAKE 8 TABLETS BY MOUTH EVERY WEEK    Dispense:  96 tablet    Refill:  0   folic acid (FOLVITE) 1 MG tablet    Sig: TAKE 2 TABLETS BY MOUTH EVERY DAY    Dispense:  180 tablet    Refill:  2     Follow-Up Instructions: Return in about 3 months (around 08/04/2021) for  Rheumatoid arthritis.   ShBo MerinoMD  Note - This record has been created using DrEditor, commissioning Chart creation errors have been sought, but may not always  have been located. Such creation errors do not reflect on  the standard of medical care.

## 2021-05-04 ENCOUNTER — Other Ambulatory Visit: Payer: Self-pay

## 2021-05-04 ENCOUNTER — Ambulatory Visit: Payer: Managed Care, Other (non HMO) | Admitting: Rheumatology

## 2021-05-04 ENCOUNTER — Encounter: Payer: Self-pay | Admitting: Rheumatology

## 2021-05-04 VITALS — BP 109/76 | HR 78 | Ht 61.75 in | Wt 231.6 lb

## 2021-05-04 DIAGNOSIS — M0579 Rheumatoid arthritis with rheumatoid factor of multiple sites without organ or systems involvement: Secondary | ICD-10-CM | POA: Diagnosis not present

## 2021-05-04 DIAGNOSIS — Z79899 Other long term (current) drug therapy: Secondary | ICD-10-CM | POA: Diagnosis not present

## 2021-05-04 DIAGNOSIS — F5101 Primary insomnia: Secondary | ICD-10-CM

## 2021-05-04 DIAGNOSIS — M79641 Pain in right hand: Secondary | ICD-10-CM

## 2021-05-04 DIAGNOSIS — N3941 Urge incontinence: Secondary | ICD-10-CM

## 2021-05-04 DIAGNOSIS — M25561 Pain in right knee: Secondary | ICD-10-CM

## 2021-05-04 DIAGNOSIS — Z8719 Personal history of other diseases of the digestive system: Secondary | ICD-10-CM

## 2021-05-04 DIAGNOSIS — M79671 Pain in right foot: Secondary | ICD-10-CM

## 2021-05-04 DIAGNOSIS — M79672 Pain in left foot: Secondary | ICD-10-CM

## 2021-05-04 DIAGNOSIS — M7711 Lateral epicondylitis, right elbow: Secondary | ICD-10-CM | POA: Diagnosis not present

## 2021-05-04 DIAGNOSIS — I10 Essential (primary) hypertension: Secondary | ICD-10-CM

## 2021-05-04 DIAGNOSIS — M25562 Pain in left knee: Secondary | ICD-10-CM

## 2021-05-04 DIAGNOSIS — E039 Hypothyroidism, unspecified: Secondary | ICD-10-CM

## 2021-05-04 DIAGNOSIS — G8929 Other chronic pain: Secondary | ICD-10-CM

## 2021-05-04 DIAGNOSIS — M79642 Pain in left hand: Secondary | ICD-10-CM

## 2021-05-04 DIAGNOSIS — R7303 Prediabetes: Secondary | ICD-10-CM

## 2021-05-04 DIAGNOSIS — Z8261 Family history of arthritis: Secondary | ICD-10-CM

## 2021-05-04 MED ORDER — TRIAMCINOLONE ACETONIDE 40 MG/ML IJ SUSP
30.0000 mg | INTRAMUSCULAR | Status: AC | PRN
  Administered 2021-05-04: 30 mg via INTRA_ARTICULAR

## 2021-05-04 MED ORDER — FOLIC ACID 1 MG PO TABS: ORAL_TABLET | ORAL | 2 refills | Status: DC

## 2021-05-04 MED ORDER — METHOTREXATE 2.5 MG PO TABS: ORAL_TABLET | ORAL | 0 refills | Status: DC

## 2021-05-04 MED ORDER — LIDOCAINE HCL 1 % IJ SOLN
1.0000 mL | INTRAMUSCULAR | Status: AC | PRN
  Administered 2021-05-04: 1 mL

## 2021-05-04 NOTE — Patient Instructions (Signed)
Standing Labs We placed an order today for your standing lab work.   Please have your standing labs drawn in September and every 3 months  If possible, please have your labs drawn 2 weeks prior to your appointment so that the provider can discuss your results at your appointment.  Please note that you may see your imaging and lab results in MyChart before we have reviewed them. We may be awaiting multiple results to interpret others before contacting you. Please allow our office up to 72 hours to thoroughly review all of the results before contacting the office for clarification of your results.  We have open lab daily: Monday through Thursday from 1:30-4:30 PM and Friday from 1:30-4:00 PM at the office of Dr. Oma Marzan, [facility].   Please be advised, all patients with office appointments requiring lab work will take precedent over walk-in lab work.  If possible, please come for your lab work on Monday and Friday afternoons, as you may experience shorter wait times. The office is located at [address], [address], [place], [place] [address] No appointment is necessary.   Labs are drawn by [facility]. Please bring your co-pay at the time of your lab draw.  You may receive a bill from [facility] for your lab work.  If you wish to have your labs drawn at another location, please call the office 24 hours in advance to send orders.  If you have any questions regarding directions or hours of operation,  please call [phone].   As a reminder, please drink plenty of water prior to coming for your lab work. Thanks! 

## 2021-06-17 NOTE — Progress Notes (Signed)
Office Visit Note  Patient: Laura Barrett             Date of Birth: 06-17-1966           MRN: [id number]             PCP: Krystal Clark, NP Referring: Rhea Bleacher* Visit Date: 06/30/2021 Occupation: @GUAROCC @  Subjective:  Neck pain.   History of Present Illness: Laura Barrett is a 55 y.o. female with a history of rheumatoid arthritis and osteoarthritis.  She states her rheumatoid arthritis is well controlled on methotrexate 8 tablets p.o. weekly.  Although she continues to have discomfort from underlying osteoarthritis.  She has pain and discomfort in her bilateral knee joints.  She had Visco supplement injections last year by orthopedics.  Her knee joints are hurting again.  She would like to have repeat Visco supplement injections.  She also have some discomfort in her hands and her knee joints.  She has not noticed any joint swelling.  Her right lateral epicondyle pain persists.  She has been using a tennis elbow brace.  She had a cortisone injection in July 2022.  Activities of Daily Living:  Patient reports morning stiffness for several hours.   Patient Reports nocturnal pain.  Difficulty dressing/grooming: Reports Difficulty climbing stairs: Reports Difficulty getting out of chair: Reports Difficulty using hands for taps, buttons, cutlery, and/or writing: Reports  Review of Systems  Constitutional:  Positive for fatigue.  HENT:  Negative for mouth sores, mouth dryness and nose dryness.   Eyes:  Negative for pain, itching, visual disturbance and dryness.  Respiratory:  Negative for cough, hemoptysis, shortness of breath and difficulty breathing.   Cardiovascular:  Negative for chest pain, palpitations and swelling in legs/feet.  Gastrointestinal:  Negative for abdominal pain, blood in stool, constipation and diarrhea.  Endocrine: Negative for increased urination.  Genitourinary:  Negative for painful urination.  Musculoskeletal:  Positive for  joint pain, joint pain, myalgias, muscle weakness, morning stiffness, muscle tenderness and myalgias. Negative for joint swelling.  Skin:  Negative for color change, rash and redness.  Allergic/Immunologic: Negative for susceptible to infections.  Neurological:  Positive for headaches and weakness. Negative for dizziness, numbness and memory loss.  Hematological:  Negative for swollen glands.  Psychiatric/Behavioral:  Positive for sleep disturbance. Negative for confusion.    PMFS History:  Patient Active Problem List   Diagnosis Date Noted   Urge incontinence 04/08/2021   Primary insomnia 04/08/2021   Prediabetes 04/08/2021   Acquired hypothyroidism 04/08/2021   History of diverticulosis 04/08/2021   Essential hypertension 04/08/2021   Primary osteoarthritis of both knees 04/08/2021   High risk medication use 04/08/2021    History reviewed. No pertinent past medical history.  Family History  Problem Relation Age of Onset   Hypertension Mother    Stroke Mother    Hypertension Father    Rheum arthritis Sister    Past Surgical History:  Procedure Laterality Date   BOWEL RESECTION  08/2012   CHOLECYSTECTOMY  01/2005   ILEOSTOMY     & reversal   Social History   Social History Narrative   Not on file   Immunization History  Administered Date(s) Administered   PFIZER(Purple Top)SARS-COV-2 Vaccination 12/29/2019, 01/26/2020, 06/25/2020, 02/04/2021     Objective: Vital Signs: BP 130/82 (BP Location: Left Arm, Patient Position: Sitting, Cuff Size: Normal)   Pulse 73   Ht 5\' 2"  (1.575 m)   Wt 230 lb (104.3 kg)   BMI  42.07 kg/m    Physical Exam Vitals and nursing note reviewed.  Constitutional:      Appearance: She is well-developed.  HENT:     Head: Normocephalic and atraumatic.  Eyes:     Conjunctiva/sclera: Conjunctivae normal.  Cardiovascular:     Rate and Rhythm: Normal rate and regular rhythm.     Heart sounds: Normal heart sounds.  Pulmonary:     Effort:  Pulmonary effort is normal.     Breath sounds: Normal breath sounds.  Abdominal:     General: Bowel sounds are normal.     Palpations: Abdomen is soft.  Musculoskeletal:     Cervical back: Normal range of motion.  Lymphadenopathy:     Cervical: No cervical adenopathy.  Skin:    General: Skin is warm and dry.     Capillary Refill: Capillary refill takes less than 2 seconds.  Neurological:     Mental Status: She is alert and oriented to person, place, and time.  Psychiatric:        Behavior: Behavior normal.    Musculoskeletal Exam: She had good range of motion of her cervical spine.  Shoulder joints, elbow joints, wrist joints, MCPs PIPs and DIPs with good range of motion with no synovitis.  She some tenderness on palpation of her right lateral epicondyle.  Hip joints, knee joints, ankles, MTPs and PIPs with good range of motion with no synovitis.  CDAI Exam: CDAI Score: 0.2  Patient Global: 1 mm; Provider Global: 1 mm Swollen: 0 ; Tender: 0  Joint Exam 06/30/2021   No joint exam has been documented for this visit   There is currently no information documented on the homunculus. Go to the Rheumatology activity and complete the homunculus joint exam.  Investigation: No additional findings.  Imaging: XR Cervical Spine 2 or 3 views  Result Date: 06/30/2021 Multilevel spondylosis with anterior osteophytes was noted.  Narrowing between C5-C6 and C6-7 was noted.  Mild facet joint arthropathy was noted. Impression: These findings are consistent with multilevel spondylosis and facet joint arthropathy.   Recent Labs: Lab Results  Component Value Date   PROT 6.6 04/08/2021   QFTBGOLDPLUS NEGATIVE 04/08/2021    Speciality Comments: Humira x 4months-discontinued 03/22 due to inadequate response.  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 Cardell Peach.Ttd by Dr. Dierdre Forth until recently.  She had no synovitis on examination.  She has been tolerating methotrexate well.  High risk medication use - Methotrexate 8 tablets p.o. weekly, folic acid 2 mg p.o. daily. - Plan: CBC with Differential/Platelet, COMPLETE METABOLIC PANEL WITH GFR.  She was advised to discontinue meloxicam while she is on methotrexate.  Neck pain -she has been experiencing increased pain and discomfort in her neck.  Plan: XR Cervical Spine 2 or 3 views.  X-ray of the C-spine showed multilevel spondylosis with C5-C6 and C6-C7 narrowing.  Facet joint arthropathy was noted.  X-ray findings were discussed with the patient.  I offered physical therapy which she declined.  I gave her a handout on C-spine exercises.  Pain in both hands-she had no synovitis on my examination.  Lateral epicondylitis, right elbow - injected in May 04, 2021.  She still continues to have some discomfort.  She has been using tennis elbow brace.  She was encouraged to do forearm  exercises.  Primary osteoarthritis of both knees - Noted by Dr. Dion Saucier.  She has had cortisone and Visco supplement injections.  X-ray showed mild osteoarthritis and mild chondromalacia patella.  Patient would like to have repeat Visco supplement injections.  We will apply for Visco supplement injections.  This patient is diagnosed with osteoarthritis of the knee(s).    Radiographs show evidence of joint space narrowing, osteophytes, subchondral sclerosis and/or subchondral cysts.  This patient has knee pain which interferes with functional and activities of daily living.    This patient has experienced inadequate response, adverse effects and/or intolerance with conservative treatments such as acetaminophen, NSAIDS, topical creams, physical therapy or regular exercise, knee bracing and/or weight loss.   This patient has experienced inadequate response or has a  contraindication to intra articular steroid injections for at least 3 months.   This patient is not scheduled to have a total knee replacement within 6 months of starting treatment with viscosupplementation.   Primary osteoarthritis of both feet - X-ray showed early osteoarthritis and calcaneal spurs.  Proper fitting shoes were discussed.  Essential hypertension-blood pressure is normal today.  History of diverticulosis  Prediabetes  Acquired hypothyroidism  Urge incontinence  Primary insomnia  Family history of rheumatoid arthritis  Orders: Orders Placed This Encounter  Procedures   XR Cervical Spine 2 or 3 views   CBC with Differential/Platelet   COMPLETE METABOLIC PANEL WITH GFR   No orders of the defined types were placed in this encounter.    Follow-Up Instructions: Return in about 5 months (around 11/30/2021) for Osteoarthritis, Rheumatoid arthritis.   Pollyann Savoy, MD  Note - This record has been created using Animal nutritionist.  Chart creation errors have been sought, but may not always  have been located. Such creation errors do not reflect on  the standard of medical care.

## 2021-06-30 ENCOUNTER — Telehealth: Payer: Self-pay | Admitting: *Deleted

## 2021-06-30 ENCOUNTER — Ambulatory Visit: Payer: Managed Care, Other (non HMO) | Admitting: Rheumatology

## 2021-06-30 ENCOUNTER — Encounter: Payer: Self-pay | Admitting: Rheumatology

## 2021-06-30 ENCOUNTER — Other Ambulatory Visit: Payer: Self-pay

## 2021-06-30 ENCOUNTER — Ambulatory Visit: Payer: Managed Care, Other (non HMO)

## 2021-06-30 VITALS — BP 130/82 | HR 73 | Ht 62.0 in | Wt 230.0 lb

## 2021-06-30 DIAGNOSIS — M79642 Pain in left hand: Secondary | ICD-10-CM

## 2021-06-30 DIAGNOSIS — Z8719 Personal history of other diseases of the digestive system: Secondary | ICD-10-CM

## 2021-06-30 DIAGNOSIS — N3941 Urge incontinence: Secondary | ICD-10-CM

## 2021-06-30 DIAGNOSIS — G8929 Other chronic pain: Secondary | ICD-10-CM

## 2021-06-30 DIAGNOSIS — M542 Cervicalgia: Secondary | ICD-10-CM | POA: Diagnosis not present

## 2021-06-30 DIAGNOSIS — Z79899 Other long term (current) drug therapy: Secondary | ICD-10-CM

## 2021-06-30 DIAGNOSIS — M79671 Pain in right foot: Secondary | ICD-10-CM

## 2021-06-30 DIAGNOSIS — M0579 Rheumatoid arthritis with rheumatoid factor of multiple sites without organ or systems involvement: Secondary | ICD-10-CM

## 2021-06-30 DIAGNOSIS — E039 Hypothyroidism, unspecified: Secondary | ICD-10-CM

## 2021-06-30 DIAGNOSIS — M19072 Primary osteoarthritis, left ankle and foot: Secondary | ICD-10-CM

## 2021-06-30 DIAGNOSIS — M19071 Primary osteoarthritis, right ankle and foot: Secondary | ICD-10-CM

## 2021-06-30 DIAGNOSIS — M79641 Pain in right hand: Secondary | ICD-10-CM | POA: Diagnosis not present

## 2021-06-30 DIAGNOSIS — F5101 Primary insomnia: Secondary | ICD-10-CM

## 2021-06-30 DIAGNOSIS — M7711 Lateral epicondylitis, right elbow: Secondary | ICD-10-CM

## 2021-06-30 DIAGNOSIS — Z8261 Family history of arthritis: Secondary | ICD-10-CM

## 2021-06-30 DIAGNOSIS — R7303 Prediabetes: Secondary | ICD-10-CM

## 2021-06-30 DIAGNOSIS — I10 Essential (primary) hypertension: Secondary | ICD-10-CM

## 2021-06-30 DIAGNOSIS — M170 Bilateral primary osteoarthritis of knee: Secondary | ICD-10-CM

## 2021-06-30 LAB — CBC WITH DIFFERENTIAL/PLATELET
Absolute Monocytes: 1010 cells/uL — ABNORMAL HIGH (ref 200–950)
Basophils Absolute: 61 cells/uL (ref 0–200)
Basophils Relative: 0.6 %
Eosinophils Absolute: 313 cells/uL (ref 15–500)
Eosinophils Relative: 3.1 %
HCT: 41.4 % (ref 35.0–45.0)
Hemoglobin: 14 g/dL (ref 11.7–15.5)
Lymphs Abs: 2656 cells/uL (ref 850–3900)
MCH: 30.6 pg (ref 27.0–33.0)
MCHC: 33.8 g/dL (ref 32.0–36.0)
MCV: 90.4 fL (ref 80.0–100.0)
MPV: 11.8 fL (ref 7.5–12.5)
Monocytes Relative: 10 %
Neutro Abs: 6060 cells/uL (ref 1500–7800)
Neutrophils Relative %: 60 %
Platelets: 260 10*3/uL (ref 140–400)
RBC: 4.58 10*6/uL (ref 3.80–5.10)
RDW: 13.1 % (ref 11.0–15.0)
Total Lymphocyte: 26.3 %
WBC: 10.1 10*3/uL (ref 3.8–10.8)

## 2021-06-30 LAB — COMPLETE METABOLIC PANEL WITH GFR
AG Ratio: 1.5 (calc) (ref 1.0–2.5)
ALT: 21 U/L (ref 6–29)
AST: 21 U/L (ref 10–35)
Albumin: 4 g/dL (ref 3.6–5.1)
Alkaline phosphatase (APISO): 82 U/L (ref 37–153)
BUN: 12 mg/dL (ref 7–25)
CO2: 21 mmol/L (ref 20–32)
Calcium: 8.7 mg/dL (ref 8.6–10.4)
Chloride: 107 mmol/L (ref 98–110)
Creat: 0.65 mg/dL (ref 0.50–1.03)
Globulin: 2.7 g/dL (calc) (ref 1.9–3.7)
Glucose, Bld: 89 mg/dL (ref 65–99)
Potassium: 4.1 mmol/L (ref 3.5–5.3)
Sodium: 139 mmol/L (ref 135–146)
Total Bilirubin: 0.4 mg/dL (ref 0.2–1.2)
Total Protein: 6.7 g/dL (ref 6.1–8.1)
eGFR: 105 mL/min/{1.73_m2} (ref >=60)

## 2021-06-30 NOTE — Telephone Encounter (Signed)
Per Dr. Corliss Skains, please apply for Visco for bilateral knees. Thanks!

## 2021-06-30 NOTE — Patient Instructions (Addendum)
Standing Labs We placed an order today for your standing lab work.   Please have your standing labs drawn in December and every 3 months  If possible, please have your labs drawn 2 weeks prior to your appointment so that the provider can discuss your results at your appointment.  Please note that you may see your imaging and lab results in MyChart before we have reviewed them. We may be awaiting multiple results to interpret others before contacting you. Please allow our office up to 72 hours to thoroughly review all of the results before contacting the office for clarification of your results.  We have open lab daily: Monday through Thursday from 1:30-4:30 PM and Friday from 1:30-4:00 PM at the office of Dr. Pollyann Savoy, [facility].   Please be advised, all patients with office appointments requiring lab work will take precedent over walk-in lab work.  If possible, please come for your lab work on Monday and Friday afternoons, as you may experience shorter wait times. The office is located at [address], [address], [place], [place] [address] No appointment is necessary.   Labs are drawn by [facility]. Please bring your co-pay at the time of your lab draw.  You may receive a bill from [facility] for your lab work.  If you wish to have your labs drawn at another location, please call the office 24 hours in advance to send orders.  If you have any questions regarding directions or hours of operation,  please call [phone].   As a reminder, please drink plenty of water prior to coming for your lab work. Thanks!   If you test POSITIVE for COVID19 and have MILD to MODERATE symptoms: First, call your PCP if you would like to receive COVID19 treatment AND Hold your medications during the infection and for at least 1 week after your symptoms have resolved: Injectable medication (Benlysta, Cimzia, Cosentyx, Enbrel, Humira, Orencia, Remicade, Simponi, Stelara, Taltz,  Tremfya) Methotrexate Leflunomide (Arava) Azathioprine Mycophenolate (Cellcept) Osborne Oman, or Rinvoq Otezla If you take Actemra or Kevzara, you DO NOT need to hold these for COVID19 infection.  If you test POSITIVE for COVID19 and have NO symptoms: First, call your PCP if you would like to receive COVID19 treatment AND Hold your medications for at least 10 days after the day that you tested positive Injectable medication (Benlysta, Cimzia, Cosentyx, Enbrel, Humira, Orencia, Remicade, Simponi, Stelara, Taltz, Tremfya) Methotrexate Leflunomide (Arava) Azathioprine Mycophenolate (Cellcept) Osborne Oman, or Rinvoq Otezla If you take Actemra or Kevzara, you DO NOT need to hold these for COVID19 infection.  If you have signs or symptoms of an infection or start antibiotics: First, call your PCP for workup of your infection. Hold your medication through the infection, until you complete your antibiotics, and until symptoms resolve if you take the following: Injectable medication (Actemra, Benlysta, Cimzia, Cosentyx, Enbrel, Humira, Kevzara, Orencia, Remicade, Simponi, Stelara, Taltz, Tremfya) Methotrexate Leflunomide (Arava) Mycophenolate (Cellcept) Harriette Ohara, Olumiant, or Rinvoq  COVID-19 vaccine recommendations:   COVID-19 vaccine is recommended for everyone (unless you are allergic to a vaccine component), even if you are on a medication that suppresses your immune system.   If you are on Methotrexate, Cellcept (mycophenolate), Rinvoq, Harriette Ohara, and Olumiant- hold the medication for 1 week after each vaccine. Hold Methotrexate for 2 weeks after the single dose COVID-19 vaccine.   Do not take Tylenol or any anti-inflammatory medications (NSAIDs) 24 hours prior to the COVID-19 vaccination.   There is no direct evidence about the  efficacy of the COVID-19 vaccine in individuals who are on medications that suppress the immune system.   Even if you are fully vaccinated, and you  are on any medications that suppress your immune system, please continue to wear a mask, maintain at least six feet social distance and practice hand hygiene.   If you develop a COVID-19 infection, please contact your PCP or our office to determine if you need monoclonal antibody infusion.  The booster vaccine is now available for immunocompromised patients.   Please see the following web sites for updated information.   [email or web address]-Patient-[email or web address]  Vaccines You are taking a medication(s) that can suppress your immune system.  The following immunizations are recommended: Flu annually Covid-19  Td/Tdap (tetanus, diphtheria, pertussis) every 10 years Pneumonia (Prevnar 15 then Pneumovax 23 at least 1 year apart.  Alternatively, can take Prevnar 20 without needing additional dose) Shingrix: 2 doses from 4 weeks to 6 months apart  Please check with your PCP to make sure you are up to date.    Cervical Strain and Sprain Rehab Ask your health care provider which exercises are safe for you. Do exercises exactly as told by your health care provider and adjust them as directed. It is normal to feel mild stretching, pulling, tightness, or discomfort as you do these exercises. Stop right away if you feel sudden pain or your pain gets worse. Do not begin these exercises until told by your health care provider. Stretching and range-of-motion exercises Cervical side bending  Using good posture, sit on a stable chair or stand up. Without moving your shoulders, slowly tilt your left / right ear to your shoulder until you feel a stretch in the opposite side neck muscles. You should be looking straight ahead. Hold for __________ seconds. Repeat with the other side of your neck. Repeat __________ times. Complete this exercise __________ times a day. Cervical rotation  Using good posture, sit on a stable chair or stand up. Slowly turn your head  to the side as if you are looking over your left / right shoulder. Keep your eyes level with the ground. Stop when you feel a stretch along the side and the back of your neck. Hold for __________ seconds. Repeat this by turning to your other side. Repeat __________ times. Complete this exercise __________ times a day. Thoracic extension and pectoral stretch Roll a towel or a small blanket so it is about 4 inches (10 cm) in diameter. Lie down on your back on a firm surface. Put the towel lengthwise, under your spine in the middle of your back. It should not be under your shoulder blades. The towel should line up with your spine from your middle back to your lower back. Put your hands behind your head and let your elbows fall out to your sides. Hold for __________ seconds. Repeat __________ times. Complete this exercise __________ times a day. Strengthening exercises Isometric upper cervical flexion Lie on your back with a thin pillow behind your head and a small rolled-up towel under your neck. Gently tuck your chin toward your chest and nod your head down to look toward your feet. Do not lift your head off the pillow. Hold for __________ seconds. Release the tension slowly. Relax your neck muscles completely before you repeat this exercise. Repeat __________ times. Complete this exercise __________ times a day. Isometric cervical extension  Stand about 6 inches (15 cm) away from a wall, with your back facing the wall. Place a soft object, about 6-8  inches (15-20 cm) in diameter, between the back of your head and the wall. A soft object could be a small pillow, a ball, or a folded towel. Gently tilt your head back and press into the soft object. Keep your jaw and forehead relaxed. Hold for __________ seconds. Release the tension slowly. Relax your neck muscles completely before you repeat this exercise. Repeat __________ times. Complete this exercise __________ times a day. Posture and  body mechanics Body mechanics refers to the movements and positions of your body while you do your daily activities. Posture is part of body mechanics. Good posture and healthy body mechanics can help to relieve stress in your body's tissues and joints. Good posture means that your spine is in its natural S-curve position (your spine is neutral), your shoulders are pulled back slightly, and your head is not tipped forward. The following are general guidelines for applying improved posture and body mechanics to your everyday activities. Sitting  When sitting, keep your spine neutral and keep your feet flat on the floor. Use a footrest, if necessary, and keep your thighs parallel to the floor. Avoid rounding your shoulders, and avoid tilting your head forward. When working at a desk or a computer, keep your desk at a height where your hands are slightly lower than your elbows. Slide your chair under your desk so you are close enough to maintain good posture. When working at a computer, place your monitor at a height where you are looking straight ahead and you do not have to tilt your head forward or downward to look at the screen. Standing  When standing, keep your spine neutral and keep your feet about hip-width apart. Keep a slight bend in your knees. Your ears, shoulders, and hips should line up. When you do a task in which you stand in one place for a long time, place one foot up on a stable object that is 2-4 inches (5-10 cm) high, such as a footstool. This helps keep your spine neutral. Resting When lying down and resting, avoid positions that are most painful for you. Try to support your neck in a neutral position. You can use a contour pillow or a small rolled-up towel. Your pillow should support your neck but not push on it. This information is not intended to replace advice given to you by your health care provider. Make sure you discuss any questions you have with your health care  provider. Document Revised: 01/15/2019 Document Reviewed: 06/26/2018 Elsevier Patient Education  2022 [employer].

## 2021-07-01 NOTE — Progress Notes (Signed)
CBC and CMP normal

## 2021-07-01 NOTE — Telephone Encounter (Signed)
Submitted for VOB 07/01/2021.

## 2021-07-06 NOTE — Telephone Encounter (Signed)
Faxed PA request for Euflexxa to [facility] 07/06/2021.

## 2021-07-18 NOTE — Telephone Encounter (Signed)
Please call patient to schedule Visco knee injections.   Authorized for [employer] series Bilateral knees. Buy and Bill. Deductible does not apply. PA for Euflexxa thru [employer]. Authorization # [id number], Effective: 07/06/2021- 08/31/2021 Insurance to cover 100% of admin, and Euflexxa cost with a $45.00 copay each visit.

## 2021-07-25 ENCOUNTER — Other Ambulatory Visit: Payer: Self-pay

## 2021-07-25 ENCOUNTER — Ambulatory Visit (INDEPENDENT_AMBULATORY_CARE_PROVIDER_SITE_OTHER): Payer: Managed Care, Other (non HMO) | Admitting: Physician Assistant

## 2021-07-25 DIAGNOSIS — M170 Bilateral primary osteoarthritis of knee: Secondary | ICD-10-CM

## 2021-07-25 MED ORDER — LIDOCAINE HCL 1 % IJ SOLN
1.5000 mL | INTRAMUSCULAR | Status: AC | PRN
  Administered 2021-07-25: 1.5 mL

## 2021-07-25 MED ORDER — SODIUM HYALURONATE (VISCOSUP) 20 MG/2ML IX SOSY
20.0000 mg | PREFILLED_SYRINGE | INTRA_ARTICULAR | Status: AC | PRN
Start: 2021-07-25 — End: 2021-07-25
  Administered 2021-07-25: 20 mg via INTRA_ARTICULAR

## 2021-07-25 MED ORDER — SODIUM HYALURONATE (VISCOSUP) 20 MG/2ML IX SOSY
20.0000 mg | PREFILLED_SYRINGE | INTRA_ARTICULAR | Status: AC | PRN
  Administered 2021-07-25: 20 mg via INTRA_ARTICULAR

## 2021-07-25 NOTE — Progress Notes (Signed)
   Procedure Note  Patient: Laura Barrett             Date of Birth: Oct 29, 1965           MRN: [id number]             Visit Date: 07/25/2021  Procedures: Visit Diagnoses:  1. Primary osteoarthritis of both knees    Euflexxa #1 bilateral knee joint injections  Large Joint Inj: bilateral knee on 07/25/2021 12:50 PM Indications: pain Details: 27 G 1.5 in needle, medial approach  Arthrogram: No  Medications (Right): 1.5 mL lidocaine 1 %; 20 mg Sodium Hyaluronate 20 MG/2ML Aspirate (Right): 0 mL Medications (Left): 1.5 mL lidocaine 1 %; 20 mg Sodium Hyaluronate 20 MG/2ML Aspirate (Left): 0 mL Outcome: tolerated well, no immediate complications Procedure, treatment alternatives, risks and benefits explained, specific risks discussed. Consent was given by the patient. Immediately prior to procedure a time out was called to verify the correct patient, procedure, equipment, support staff and site/side marked as required. Patient was prepped and draped in the usual sterile fashion.     Patient tolerated the procedure well.  Aftercare was discussed.   Sherron Ales, PA-C

## 2021-08-01 ENCOUNTER — Other Ambulatory Visit: Payer: Self-pay

## 2021-08-01 ENCOUNTER — Ambulatory Visit: Payer: Managed Care, Other (non HMO) | Admitting: Physician Assistant

## 2021-08-01 DIAGNOSIS — M170 Bilateral primary osteoarthritis of knee: Secondary | ICD-10-CM | POA: Diagnosis not present

## 2021-08-01 MED ORDER — LIDOCAINE HCL 1 % IJ SOLN
1.5000 mL | INTRAMUSCULAR | Status: AC | PRN
  Administered 2021-08-01: 1.5 mL via INTRA_ARTICULAR

## 2021-08-01 MED ORDER — SODIUM HYALURONATE (VISCOSUP) 20 MG/2ML IX SOSY
20.0000 mg | PREFILLED_SYRINGE | INTRA_ARTICULAR | Status: AC | PRN
  Administered 2021-08-01: 20 mg via INTRA_ARTICULAR

## 2021-08-01 NOTE — Progress Notes (Signed)
   Procedure Note  Patient: Laura Barrett             Date of Birth: 07/06/1966           MRN: [id number]             Visit Date: 08/01/2021  Procedures: Visit Diagnoses:  1. Primary osteoarthritis of both knees     Euflexxa #2 bilateral knees, B/B Large Joint Inj: bilateral knee on 08/01/2021 7:56 AM Indications: pain Details: 27 G 1.5 in needle, medial approach  Arthrogram: No  Medications (Right): 1.5 mL lidocaine 1 %; 20 mg Sodium Hyaluronate 20 MG/2ML Aspirate (Right): 0 mL Medications (Left): 1.5 mL lidocaine 1 %; 20 mg Sodium Hyaluronate 20 MG/2ML Aspirate (Left): 0 mL Outcome: tolerated well, no immediate complications Procedure, treatment alternatives, risks and benefits explained, specific risks discussed. Consent was given by the patient. Immediately prior to procedure a time out was called to verify the correct patient, procedure, equipment, support staff and site/side marked as required. Patient was prepped and draped in the usual sterile fashion.   Patient tolerated the procedure well.  Aftercare was discussed.  Sherron Ales, PA-C

## 2021-08-08 ENCOUNTER — Ambulatory Visit: Payer: Managed Care, Other (non HMO) | Admitting: Physician Assistant

## 2021-08-08 ENCOUNTER — Other Ambulatory Visit: Payer: Self-pay

## 2021-08-08 DIAGNOSIS — M170 Bilateral primary osteoarthritis of knee: Secondary | ICD-10-CM

## 2021-08-08 MED ORDER — SODIUM HYALURONATE (VISCOSUP) 20 MG/2ML IX SOSY
20.0000 mg | PREFILLED_SYRINGE | INTRA_ARTICULAR | Status: AC | PRN
  Administered 2021-08-08: 20 mg via INTRA_ARTICULAR

## 2021-08-08 MED ORDER — LIDOCAINE HCL 1 % IJ SOLN
1.5000 mL | INTRAMUSCULAR | Status: AC | PRN
  Administered 2021-08-08: 1.5 mL via INTRA_ARTICULAR

## 2021-08-08 NOTE — Progress Notes (Signed)
   Procedure Note  Patient: Laura Barrett             Date of Birth: [birth year]           MRN: [id number]             Visit Date: 08/08/2021  Procedures: Visit Diagnoses:  1. Primary osteoarthritis of both knees    Euflexxa #3 bilateral knees, B/B Large Joint Inj: bilateral knee on 08/08/2021 11:48 AM Indications: pain Details: 27 G 1.5 in needle, medial approach  Arthrogram: No  Medications (Right): 1.5 mL lidocaine 1 %; 20 mg Sodium Hyaluronate 20 MG/2ML Aspirate (Right): 0 mL Medications (Left): 1.5 mL lidocaine 1 %; 20 mg Sodium Hyaluronate 20 MG/2ML Aspirate (Left): 0 mL Outcome: tolerated well, no immediate complications Procedure, treatment alternatives, risks and benefits explained, specific risks discussed. Consent was given by the patient. Immediately prior to procedure a time out was called to verify the correct patient, procedure, equipment, support staff and site/side marked as required. Patient was prepped and draped in the usual sterile fashion.    Patient tolerated the procedure well.  Aftercare was discussed.  Sherron Ales, PA-C

## 2021-08-30 ENCOUNTER — Other Ambulatory Visit: Payer: Self-pay | Admitting: Rheumatology

## 2021-08-30 NOTE — Telephone Encounter (Signed)
Next Visit: 12/01/2021  Last Visit: 06/30/2021  Last Fill: 05/04/2021  DX: Rheumatoid arthritis with rheumatoid factor of multiple sites without organ or systems involvement   Current Dose per office note 06/30/2021: Methotrexate 8 tablets p.o. weekly  Labs: 06/30/2021, Methotrexate 8 tablets p.o. weekly  Okay to refill MTX?

## 2021-09-27 ENCOUNTER — Telehealth: Payer: Self-pay

## 2021-09-27 NOTE — Telephone Encounter (Signed)
Patient left a voicemail stating she tested positive for Covid on 09/17/21 and requested a return call to let her know when she can restart her Methotrexate.

## 2021-09-27 NOTE — Telephone Encounter (Signed)
Patient advised she may restart her MTX 1 week after symptoms have resolved. Patient states she is having some swollen glands and is trying to get in with PCP. Patient advised not to restart MTX until she has been evaluated by PCP and infection has been ruled out.

## 2021-10-06 ENCOUNTER — Encounter: Payer: Self-pay | Admitting: Specialist

## 2021-10-06 ENCOUNTER — Telehealth: Payer: Self-pay | Admitting: Rheumatology

## 2021-10-06 NOTE — Telephone Encounter (Signed)
Patient advised she will need clearance from PCP that infection has cleared prior to being able to resume MTX. Patient advised to contact the office if she begins to flare.

## 2021-10-06 NOTE — Telephone Encounter (Signed)
Patient left a voicemail stating she tested positive for Covid on 09/17/21 and hasn't taken her Methotrexate since because she had swollen "glands". Patient states she was taking antibiotics but she is still swollen and wants to know when she can resume her Methotrexate.

## 2021-10-11 DIAGNOSIS — Z8616 Personal history of COVID-19: Secondary | ICD-10-CM | POA: Insufficient documentation

## 2021-10-13 DIAGNOSIS — R09A2 Foreign body sensation, throat: Secondary | ICD-10-CM | POA: Insufficient documentation

## 2021-10-13 DIAGNOSIS — R590 Localized enlarged lymph nodes: Secondary | ICD-10-CM | POA: Insufficient documentation

## 2021-10-28 ENCOUNTER — Telehealth: Payer: Self-pay

## 2021-10-28 NOTE — Telephone Encounter (Signed)
Patient called stating she has taken her Methotrexate since 09/11/21 because of an infection.  Patient is planning to restart the medication on Sunday, 10/30/21 and is asking if she starts with the same dose 8 tablets.

## 2021-10-28 NOTE — Telephone Encounter (Signed)
Patient states her infection has cleared. Patient advised she would restart her MTX at the same dose as she was previously taking. Patient expressed understanding.

## 2021-11-18 NOTE — Progress Notes (Signed)
Office Visit Note  Patient: Laura Barrett             Date of Birth: [birth year]           MRN: [id number]             PCP: Krystal ClarkBrown-Patram, Melissa Joyce, NP Referring: Rhea BleacherBrown-Patram, Melissa J* Visit Date: 12/01/2021 Occupation: @GUAROCC @  Subjective:  Medication management  History of Present Illness: Laura Barrett is a 56 y.o. female with a history of seropositive rheumatoid arthritis, osteoarthritis and degenerative disc disease.  She states she has been gradually getting better on methotrexate.  She denies any joint swelling.  She continues to have some discomfort in her knee joints in her feet.  She has not noticed any joint swelling.  Right lateral epicondylitis has improved.  She has been doing exercises on a regular basis.  She continues to have discomfort in her cervical spine due to underlying disc disease.  Patient states that she developed COVID-19 virus infection in December 2022.  She developed severe cervical lymphadenopathy after that.  She stopped methotrexate for about 6 weeks which she resumed recently.  She states for the cervical lymphadenopathy she was evaluated by her PCP and ENT.  She also had ultrasound of her neck.  She was placed on prednisone taper and Z-Pak.  Eventually the cervical lymphadenopathy resolved per patient.  Activities of Daily Living:  Patient reports morning stiffness for all day. Patient Reports nocturnal pain.  Difficulty dressing/grooming: Denies Difficulty climbing stairs: Reports Difficulty getting out of chair: Denies Difficulty using hands for taps, buttons, cutlery, and/or writing: Reports  Review of Systems  Constitutional:  Positive for fatigue.  HENT:  Negative for mouth sores, mouth dryness and nose dryness.   Eyes:  Negative for pain, itching and dryness.  Respiratory:  Negative for shortness of breath and difficulty breathing.   Cardiovascular:  Negative for chest pain and palpitations.  Gastrointestinal:  Negative for blood  in stool, constipation and diarrhea.  Endocrine: Negative for increased urination.  Genitourinary:  Negative for difficulty urinating.  Musculoskeletal:  Positive for joint pain, joint pain and morning stiffness. Negative for joint swelling, myalgias, muscle tenderness and myalgias.  Skin:  Negative for color change, rash and redness.  Allergic/Immunologic: Negative for susceptible to infections.  Neurological:  Positive for weakness. Negative for dizziness, numbness, headaches and memory loss.  Hematological:  Negative for bruising/bleeding tendency.  Psychiatric/Behavioral:  Negative for confusion.    PMFS History:  Patient Active Problem List   Diagnosis Date Noted   Urge incontinence 04/08/2021   Primary insomnia 04/08/2021   Prediabetes 04/08/2021   Acquired hypothyroidism 04/08/2021   History of diverticulosis 04/08/2021   Essential hypertension 04/08/2021   Primary osteoarthritis of both knees 04/08/2021   High risk medication use 04/08/2021    History reviewed. No pertinent past medical history.  Family History  Problem Relation Age of Onset   Hypertension Mother    Stroke Mother    Hypertension Father    Rheum arthritis Sister    Past Surgical History:  Procedure Laterality Date   BOWEL RESECTION  08/2012   CHOLECYSTECTOMY  01/2005   ILEOSTOMY     & reversal   Social History   Social History Narrative   Not on file   Immunization History  Administered Date(s) Administered   PFIZER(Purple Top)SARS-COV-2 Vaccination 12/29/2019, 01/26/2020, 06/25/2020, 02/04/2021     Objective: Vital Signs: BP 126/85 (BP Location: Left Arm, Patient Position: Sitting, Cuff Size: Large)  Pulse 71    Ht 5\' 2"  (1.575 m)    Wt 231 lb 12.8 oz (105.1 kg)    BMI 42.40 kg/m    Physical Exam Vitals and nursing note reviewed.  Constitutional:      Appearance: She is well-developed.  HENT:     Head: Normocephalic and atraumatic.  Eyes:     Conjunctiva/sclera: Conjunctivae normal.   Cardiovascular:     Rate and Rhythm: Normal rate and regular rhythm.     Heart sounds: Normal heart sounds.  Pulmonary:     Effort: Pulmonary effort is normal.     Breath sounds: Normal breath sounds.  Abdominal:     General: Bowel sounds are normal.     Palpations: Abdomen is soft.  Musculoskeletal:     Cervical back: Normal range of motion.  Lymphadenopathy:     Cervical: No cervical adenopathy.  Skin:    General: Skin is warm and dry.     Capillary Refill: Capillary refill takes less than 2 seconds.  Neurological:     Mental Status: She is alert and oriented to person, place, and time.  Psychiatric:        Behavior: Behavior normal.     Musculoskeletal Exam: C-spine was in good range of motion with minimal discomfort.  Shoulder joints, elbow joints, wrist joints, MCPs PIPs and DIPs with good range of motion with no synovitis.  Hip joints, knee joints, ankles, MTPs and PIPs with good range of motion with no synovitis.  CDAI Exam: CDAI Score: 0.5  Patient Global: 3 mm; Provider Global: 2 mm Swollen: 0 ; Tender: 0  Joint Exam 12/01/2021   No joint exam has been documented for this visit   There is currently no information documented on the homunculus. Go to the Rheumatology activity and complete the homunculus joint exam.  Investigation: No additional findings.  Imaging: No results found.  Recent Labs: Lab Results  Component Value Date   WBC 10.1 06/30/2021   HGB 14.0 06/30/2021   PLT 260 06/30/2021   NA 139 06/30/2021   K 4.1 06/30/2021   CL 107 06/30/2021   CO2 21 06/30/2021   GLUCOSE 89 06/30/2021   BUN 12 06/30/2021   CREATININE 0.65 06/30/2021   BILITOT 0.4 06/30/2021   AST 21 06/30/2021   ALT 21 06/30/2021   PROT 6.7 06/30/2021   CALCIUM 8.7 06/30/2021   QFTBGOLDPLUS NEGATIVE 04/08/2021    Speciality Comments: Humira x 32months-discontinued 03/22 due to inadequate response.  Schedule appointment every 3 months for compliance  Procedures:  No  procedures performed Allergies: Ramipril   Assessment / Plan:     Visit Diagnoses: Rheumatoid arthritis with rheumatoid factor of multiple sites without organ or systems involvement (HCC) - RF 197, anti-CCP> 250, Dxd in 2011 by Dr. Abner Greenspan.Ttd by Dr. Amil Amen until recently.  Patient has been doing well on methotrexate 8 tablets p.o. weekly.  She had to stop methotrexate when she developed COVID-19 infection.  She was off methotrexate for about 6 weeks.  She recently resumed methotrexate.  She denies any joint pain or joint swelling today.  She had no synovitis on my examination.  High risk medication use - Methotrexate 8 tablets p.o. weekly, folic acid 2 mg p.o. daily.  -Her last labs were from September 2022 which were within normal limits.  We will check labs today and every 3 months to monitor for drug toxicity.  Plan: CBC with Differential/Platelet, COMPLETE METABOLIC PANEL WITH GFR.  She has been advised to  hold methotrexate in case she develops an infection and resume after the infection resolves.  Information regarding immunization was also placed in the AVS.  Pain in both hands-she has off-and-on discomfort in her hands.  No synovitis was noted.  Lateral epicondylitis, right elbow - injected in May 04, 2021.  She noted improvement in her lateral epicondylitis.  She is doing exercises on a regular basis.  Primary osteoarthritis of both knees - s/p euflexxa bilateral knees 07/2021.  She denies any discomfort in her knee joints today.  Although she has off-and-on discomfort in her knee joints.  She has been trying to do exercises on a regular basis now.  Primary osteoarthritis of both feet - X-ray showed early osteoarthritis and calcaneal spurs.  She has chronic discomfort in her knee joints.  No synovitis was noted.  DDD (degenerative disc disease), cervical - X-ray of the C-spine showed multilevel spondylosis with C5-C6 and C6-C7 narrowing.  Facet joint arthropathy was noted.  She continues to  have neck pain and stiffness.  Range of motion exercises were emphasized.  BMI 42.40-we had detailed discussion regarding increased risk of heart disease with rheumatoid arthritis.  Dietary modifications and exercises were emphasized.  I also gave her a handout on weight management clinic.  Essential hypertension-blood pressure was normal today.  Prediabetes-dietary modifications were discussed.  Acquired hypothyroidism  Urge incontinence  History of diverticulosis  Primary insomnia  Family history of rheumatoid arthritis  COVID-19 virus infection - December 2022.  She states she was treated with oral antiviral medication.  She had cervical lymphadenopathy for several weeks.  Patient states that the lymphadenopathy eventually resolved after a course of Z-Pak and prednisone.  Orders: Orders Placed This Encounter  Procedures   CBC with Differential/Platelet   COMPLETE METABOLIC PANEL WITH GFR   No orders of the defined types were placed in this encounter.    Follow-Up Instructions: Return in about 3 months (around 02/28/2022) for Rheumatoid arthritis, Osteoarthritis.   Bo Merino, MD  Note - This record has been created using Editor, commissioning.  Chart creation errors have been sought, but may not always  have been located. Such creation errors do not reflect on  the standard of medical care.

## 2021-12-01 ENCOUNTER — Ambulatory Visit: Payer: Managed Care, Other (non HMO) | Admitting: Rheumatology

## 2021-12-01 ENCOUNTER — Other Ambulatory Visit: Payer: Self-pay

## 2021-12-01 ENCOUNTER — Encounter: Payer: Self-pay | Admitting: Rheumatology

## 2021-12-01 VITALS — BP 126/85 | HR 71 | Ht 62.0 in | Wt 231.8 lb

## 2021-12-01 DIAGNOSIS — M170 Bilateral primary osteoarthritis of knee: Secondary | ICD-10-CM

## 2021-12-01 DIAGNOSIS — M0579 Rheumatoid arthritis with rheumatoid factor of multiple sites without organ or systems involvement: Secondary | ICD-10-CM | POA: Diagnosis not present

## 2021-12-01 DIAGNOSIS — N3941 Urge incontinence: Secondary | ICD-10-CM

## 2021-12-01 DIAGNOSIS — I10 Essential (primary) hypertension: Secondary | ICD-10-CM

## 2021-12-01 DIAGNOSIS — M79641 Pain in right hand: Secondary | ICD-10-CM | POA: Diagnosis not present

## 2021-12-01 DIAGNOSIS — M7711 Lateral epicondylitis, right elbow: Secondary | ICD-10-CM

## 2021-12-01 DIAGNOSIS — E039 Hypothyroidism, unspecified: Secondary | ICD-10-CM

## 2021-12-01 DIAGNOSIS — Z6841 Body Mass Index (BMI) 40.0 and over, adult: Secondary | ICD-10-CM

## 2021-12-01 DIAGNOSIS — M79642 Pain in left hand: Secondary | ICD-10-CM

## 2021-12-01 DIAGNOSIS — M542 Cervicalgia: Secondary | ICD-10-CM

## 2021-12-01 DIAGNOSIS — M19071 Primary osteoarthritis, right ankle and foot: Secondary | ICD-10-CM

## 2021-12-01 DIAGNOSIS — Z8719 Personal history of other diseases of the digestive system: Secondary | ICD-10-CM

## 2021-12-01 DIAGNOSIS — R7303 Prediabetes: Secondary | ICD-10-CM

## 2021-12-01 DIAGNOSIS — Z8261 Family history of arthritis: Secondary | ICD-10-CM

## 2021-12-01 DIAGNOSIS — F5101 Primary insomnia: Secondary | ICD-10-CM

## 2021-12-01 DIAGNOSIS — U071 COVID-19: Secondary | ICD-10-CM

## 2021-12-01 DIAGNOSIS — M5030 Other cervical disc degeneration, unspecified cervical region: Secondary | ICD-10-CM

## 2021-12-01 DIAGNOSIS — Z79899 Other long term (current) drug therapy: Secondary | ICD-10-CM

## 2021-12-01 DIAGNOSIS — M19072 Primary osteoarthritis, left ankle and foot: Secondary | ICD-10-CM

## 2021-12-01 LAB — COMPLETE METABOLIC PANEL WITH GFR
AG Ratio: 1.5 (calc) (ref 1.0–2.5)
ALT: 26 U/L (ref 6–29)
AST: 22 U/L (ref 10–35)
Albumin: 4.1 g/dL (ref 3.6–5.1)
Alkaline phosphatase (APISO): 92 U/L (ref 37–153)
BUN: 13 mg/dL (ref 7–25)
CO2: 25 mmol/L (ref 20–32)
Calcium: 9.1 mg/dL (ref 8.6–10.4)
Chloride: 106 mmol/L (ref 98–110)
Creat: 0.76 mg/dL (ref 0.50–1.03)
Globulin: 2.8 g/dL (calc) (ref 1.9–3.7)
Glucose, Bld: 99 mg/dL (ref 65–99)
Potassium: 4.2 mmol/L (ref 3.5–5.3)
Sodium: 139 mmol/L (ref 135–146)
Total Bilirubin: 0.6 mg/dL (ref 0.2–1.2)
Total Protein: 6.9 g/dL (ref 6.1–8.1)
eGFR: 93 mL/min/{1.73_m2} (ref >=60)

## 2021-12-01 LAB — CBC WITH DIFFERENTIAL/PLATELET
Absolute Monocytes: 912 cells/uL (ref 200–950)
Basophils Absolute: 38 cells/uL (ref 0–200)
Basophils Relative: 0.4 %
Eosinophils Absolute: 162 cells/uL (ref 15–500)
Eosinophils Relative: 1.7 %
HCT: 43.2 % (ref 35.0–45.0)
Hemoglobin: 14.5 g/dL (ref 11.7–15.5)
Lymphs Abs: 2613 cells/uL (ref 850–3900)
MCH: 29.3 pg (ref 27.0–33.0)
MCHC: 33.6 g/dL (ref 32.0–36.0)
MCV: 87.3 fL (ref 80.0–100.0)
MPV: 11.3 fL (ref 7.5–12.5)
Monocytes Relative: 9.6 %
Neutro Abs: 5776 cells/uL (ref 1500–7800)
Neutrophils Relative %: 60.8 %
Platelets: 280 10*3/uL (ref 140–400)
RBC: 4.95 10*6/uL (ref 3.80–5.10)
RDW: 13 % (ref 11.0–15.0)
Total Lymphocyte: 27.5 %
WBC: 9.5 10*3/uL (ref 3.8–10.8)

## 2021-12-01 NOTE — Patient Instructions (Signed)
Standing Labs °We placed an order today for your standing lab work.  ° °Please have your standing labs drawn in May and every 3 months ° °If possible, please have your labs drawn 2 weeks prior to your appointment so that the provider can discuss your results at your appointment. ° °Please note that you may see your imaging and lab results in MyChart before we have reviewed them. °We may be awaiting multiple results to interpret others before contacting you. °Please allow our office up to 72 hours to thoroughly review all of the results before contacting the office for clarification of your results. ° °We have open lab daily: °Monday through Thursday from 1:30-4:30 PM and Friday from 1:30-4:00 PM °at the office of Dr. Mackenzy Grumbine, [facility].   °Please be advised, all patients with office appointments requiring lab work will take precedent over walk-in lab work.  °If possible, please come for your lab work on Monday and Friday afternoons, as you may experience shorter wait times. °The office is located at [address], [address], [place], [place] [address] °No appointment is necessary.   °Labs are drawn by [facility]. Please bring your co-pay at the time of your lab draw.  You may receive a bill from [facility] for your lab work. ° °Please note if you are on Hydroxychloroquine and and an order has been placed for a Hydroxychloroquine level, you will need to have it drawn 4 hours or more after your last dose. ° °If you wish to have your labs drawn at another location, please call the office 24 hours in advance to send orders. ° °If you have any questions regarding directions or hours of operation,  °please call [phone].   °As a reminder, please drink plenty of water prior to coming for your lab work. Thanks!  ° °Vaccines °You are taking a medication(s) that can suppress your immune system.  The following immunizations are recommended: °Flu annually °Covid-19  °Td/Tdap (tetanus, diphtheria, pertussis)  every 10 years °Pneumonia (Prevnar 15 then Pneumovax 23 at least 1 year apart.  Alternatively, can take Prevnar 20 without needing additional dose) °Shingrix: 2 doses from 4 weeks to 6 months apart ° °Please check with your PCP to make sure you are up to date.  ° °If you have signs or symptoms of an infection or start antibiotics: °First, call your PCP for workup of your infection. °Hold your medication through the infection, until you complete your antibiotics, and until symptoms resolve if you take the following: °Injectable medication (Actemra, Benlysta, Cimzia, Cosentyx, Enbrel, Humira, Kevzara, Orencia, Remicade, Simponi, Stelara, Taltz, Tremfya) °Methotrexate °Leflunomide (Arava) °Mycophenolate (Cellcept) °Xeljanz, Olumiant, or Rinvoq  °

## 2021-12-02 NOTE — Progress Notes (Signed)
CBC and CMP are normal.

## 2021-12-14 IMAGING — CR DG CHEST 2V
2 series · 2 of 2 positions shown · non-contrast
Comparison: None

CLINICAL DATA: Immunosuppressive therapy

EXAM:
CHEST - 2 VIEW

[chest pa]
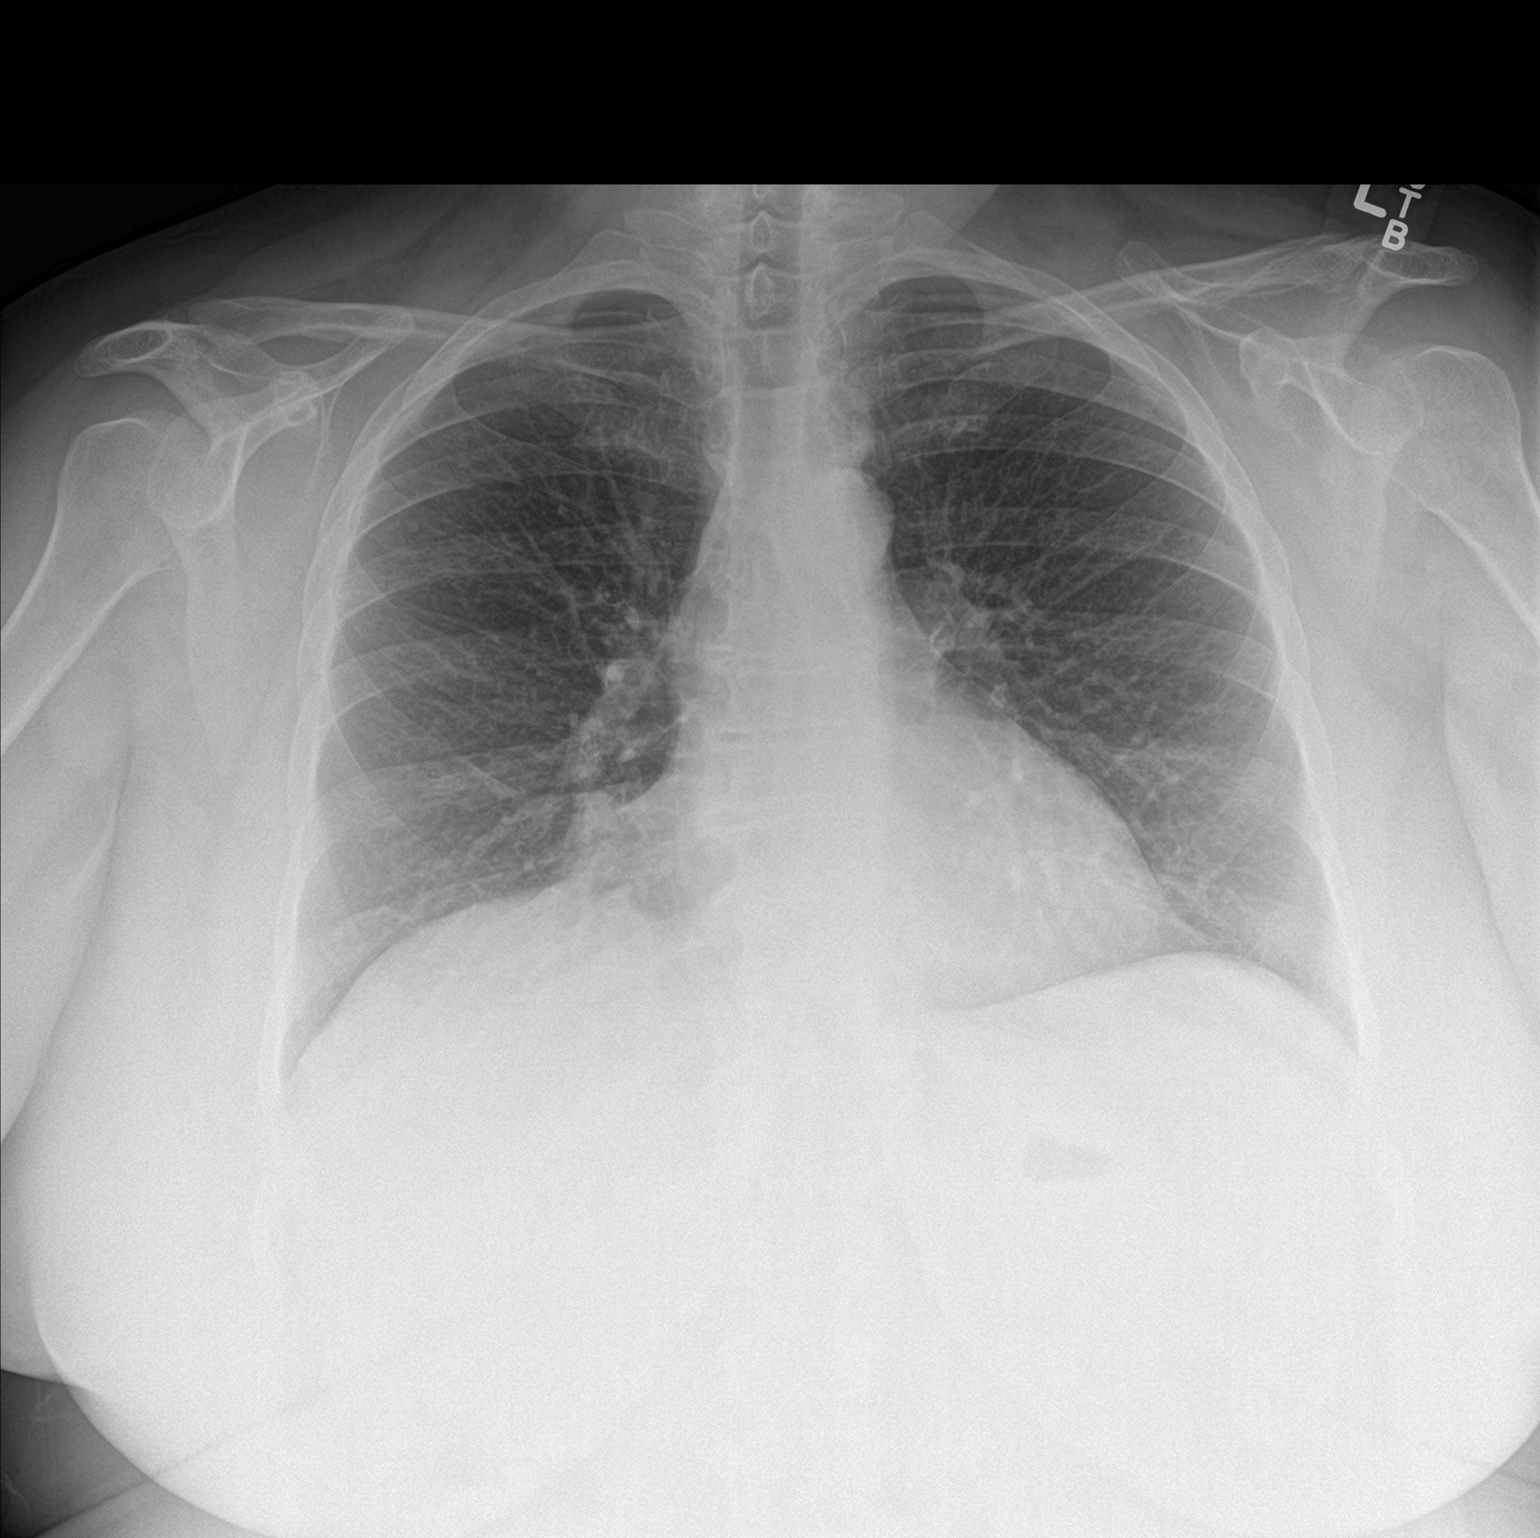

[chest lat]
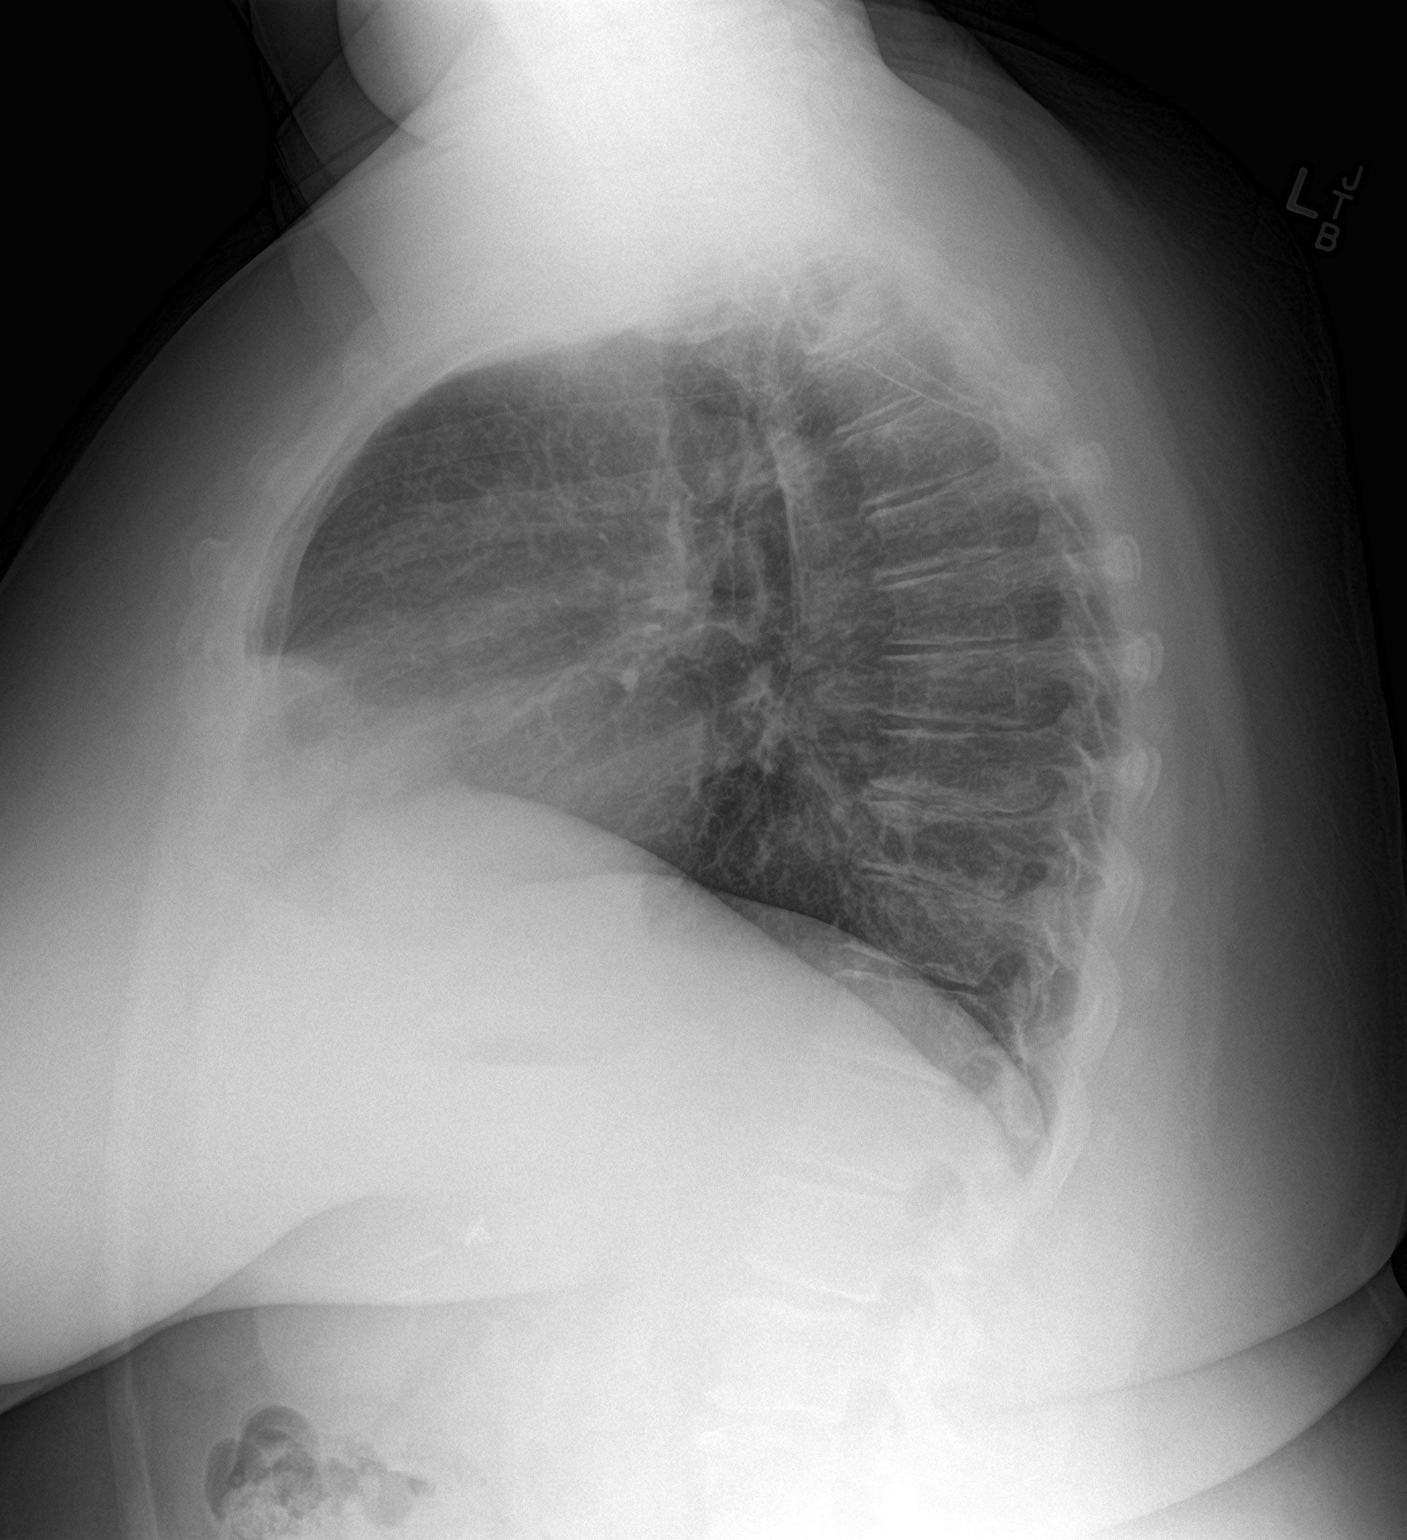

[2 of 2 positions shown; findings below may reference images not displayed]

FINDINGS: 4 mm calcified right upper lobe pulmonary nodule likely reflecting
sequela prior granulomatous disease. No focal consolidation. No
pleural effusion or pneumothorax. Heart and mediastinal contours are
unremarkable.

No acute osseous abnormality.
IMPRESSION: No active cardiopulmonary disease.

## 2022-01-06 ENCOUNTER — Other Ambulatory Visit: Payer: Self-pay | Admitting: Physician Assistant

## 2022-01-06 NOTE — Telephone Encounter (Signed)
Next Visit: 03/02/2022 ? ?Last Visit: 12/01/2021 ? ?Last Fill: 08/30/2021 ? ?DX: Rheumatoid arthritis with rheumatoid factor of multiple sites without organ or systems involvement  ? ?Current Dose per office note 12/01/2021: Methotrexate 8 tablets p.o. weekly ? ?Labs: 12/01/2021 CBC and CMP are normal. ? ?Okay to refill MTX?  ?

## 2022-02-07 ENCOUNTER — Telehealth: Payer: Self-pay | Admitting: Rheumatology

## 2022-02-07 NOTE — Telephone Encounter (Signed)
Patient called the office requesting to have visco injections again. Eligible for reinjection in May. Ok to reapply? ?

## 2022-02-08 NOTE — Telephone Encounter (Signed)
VOB submitted for Euflexxa, bilateral knees BV pending 

## 2022-02-13 NOTE — Telephone Encounter (Signed)
PA submitted for Euflexxa 

## 2022-02-23 NOTE — Telephone Encounter (Signed)
Please call to schedule visco injections  Approved for Euflexxa, bilateral knees Buy & Bill Covered at 100% of the allowable amount. $45 copay Deductible does not apply. Auth# [id number] Valid thru 02/22/22 to 03/15/22

## 2022-02-27 NOTE — Progress Notes (Deleted)
Office Visit Note  Patient: Laura Barrett             Date of Birth: [birth year]           MRN: [id number]             PCP: Krystal Clark, NP Referring: Rhea Bleacher* Visit Date: 02/28/2022 Occupation: @GUAROCC @  Subjective:    History of Present Illness: Laura Barrett is a 56 y.o. female with history of seropositive rheumatoid arthritis and osteoarthritis.  She is taking methotrexate 8 tablets by mouth once weekly and folic acid 2 mg daily.   CBC and CMP updated on 12/01/21.   TB gold negative on 04/08/21.  Future order for TB gold placed today.   Visco October 2022   Activities of Daily Living:  Patient reports morning stiffness for *** {minute/hour:19697}.   Patient {ACTIONS;DENIES/REPORTS:21021675::"Denies"} nocturnal pain.  Difficulty dressing/grooming: {ACTIONS;DENIES/REPORTS:21021675::"Denies"} Difficulty climbing stairs: {ACTIONS;DENIES/REPORTS:21021675::"Denies"} Difficulty getting out of chair: {ACTIONS;DENIES/REPORTS:21021675::"Denies"} Difficulty using hands for taps, buttons, cutlery, and/or writing: {ACTIONS;DENIES/REPORTS:21021675::"Denies"}  No Rheumatology ROS completed.   PMFS History:  Patient Active Problem List   Diagnosis Date Noted   Urge incontinence 04/08/2021   Primary insomnia 04/08/2021   Prediabetes 04/08/2021   Acquired hypothyroidism 04/08/2021   History of diverticulosis 04/08/2021   Essential hypertension 04/08/2021   Primary osteoarthritis of both knees 04/08/2021   High risk medication use 04/08/2021    No past medical history on file.  Family History  Problem Relation Age of Onset   Hypertension Mother    Stroke Mother    Hypertension Father    Rheum arthritis Sister    Past Surgical History:  Procedure Laterality Date   BOWEL RESECTION  08/2012   CHOLECYSTECTOMY  01/2005   ILEOSTOMY     & reversal   Social History   Social History Narrative   Not on file   Immunization History   Administered Date(s) Administered   PFIZER(Purple Top)SARS-COV-2 Vaccination 12/29/2019, 01/26/2020, 06/25/2020, 02/04/2021     Objective: Vital Signs: There were no vitals taken for this visit.   Physical Exam Vitals and nursing note reviewed.  Constitutional:      Appearance: She is well-developed.  HENT:     Head: Normocephalic and atraumatic.  Eyes:     Conjunctiva/sclera: Conjunctivae normal.  Cardiovascular:     Rate and Rhythm: Normal rate and regular rhythm.     Heart sounds: Normal heart sounds.  Pulmonary:     Effort: Pulmonary effort is normal.     Breath sounds: Normal breath sounds.  Abdominal:     General: Bowel sounds are normal.     Palpations: Abdomen is soft.  Musculoskeletal:     Cervical back: Normal range of motion.  Skin:    General: Skin is warm and dry.     Capillary Refill: Capillary refill takes less than 2 seconds.  Neurological:     Mental Status: She is alert and oriented to person, place, and time.  Psychiatric:        Behavior: Behavior normal.     Musculoskeletal Exam: ***  CDAI Exam: CDAI Score: -- Patient Global: --; Provider Global: -- Swollen: --; Tender: -- Joint Exam 02/28/2022   No joint exam has been documented for this visit   There is currently no information documented on the homunculus. Go to the Rheumatology activity and complete the homunculus joint exam.  Investigation: No additional findings.  Imaging: No results found.  Recent Labs: Lab Results  Component Value  Date   WBC 9.5 12/01/2021   HGB 14.5 12/01/2021   PLT 280 12/01/2021   NA 139 12/01/2021   K 4.2 12/01/2021   CL 106 12/01/2021   CO2 25 12/01/2021   GLUCOSE 99 12/01/2021   BUN 13 12/01/2021   CREATININE 0.76 12/01/2021   BILITOT 0.6 12/01/2021   AST 22 12/01/2021   ALT 26 12/01/2021   PROT 6.9 12/01/2021   CALCIUM 9.1 12/01/2021   QFTBGOLDPLUS NEGATIVE 04/08/2021    Speciality Comments: Humira x 50months-discontinued 03/22 due to  inadequate response.  Schedule appointment every 3 months for compliance  Procedures:  No procedures performed Allergies: Ramipril   Assessment / Plan:     Visit Diagnoses: No diagnosis found.  Orders: No orders of the defined types were placed in this encounter.  No orders of the defined types were placed in this encounter.   Face-to-face time spent with patient was *** minutes. Greater than 50% of time was spent in counseling and coordination of care.  Follow-Up Instructions: No follow-ups on file.   Ellen Henri, CMA  Note - This record has been created using Animal nutritionist.  Chart creation errors have been sought, but may not always  have been located. Such creation errors do not reflect on  the standard of medical care.

## 2022-02-28 ENCOUNTER — Ambulatory Visit: Payer: Managed Care, Other (non HMO) | Admitting: Physician Assistant

## 2022-02-28 DIAGNOSIS — E039 Hypothyroidism, unspecified: Secondary | ICD-10-CM

## 2022-02-28 DIAGNOSIS — M170 Bilateral primary osteoarthritis of knee: Secondary | ICD-10-CM

## 2022-02-28 DIAGNOSIS — Z111 Encounter for screening for respiratory tuberculosis: Secondary | ICD-10-CM

## 2022-02-28 DIAGNOSIS — U071 COVID-19: Secondary | ICD-10-CM

## 2022-02-28 DIAGNOSIS — Z8261 Family history of arthritis: Secondary | ICD-10-CM

## 2022-02-28 DIAGNOSIS — N3941 Urge incontinence: Secondary | ICD-10-CM

## 2022-02-28 DIAGNOSIS — M79641 Pain in right hand: Secondary | ICD-10-CM

## 2022-02-28 DIAGNOSIS — M0579 Rheumatoid arthritis with rheumatoid factor of multiple sites without organ or systems involvement: Secondary | ICD-10-CM

## 2022-02-28 DIAGNOSIS — F5101 Primary insomnia: Secondary | ICD-10-CM

## 2022-02-28 DIAGNOSIS — M5030 Other cervical disc degeneration, unspecified cervical region: Secondary | ICD-10-CM

## 2022-02-28 DIAGNOSIS — I10 Essential (primary) hypertension: Secondary | ICD-10-CM

## 2022-02-28 DIAGNOSIS — M19071 Primary osteoarthritis, right ankle and foot: Secondary | ICD-10-CM

## 2022-02-28 DIAGNOSIS — R7303 Prediabetes: Secondary | ICD-10-CM

## 2022-02-28 DIAGNOSIS — Z79899 Other long term (current) drug therapy: Secondary | ICD-10-CM

## 2022-02-28 DIAGNOSIS — Z8719 Personal history of other diseases of the digestive system: Secondary | ICD-10-CM

## 2022-02-28 DIAGNOSIS — M7711 Lateral epicondylitis, right elbow: Secondary | ICD-10-CM

## 2022-02-28 NOTE — Progress Notes (Unsigned)
Office Visit Note  Patient: Laura Barrett             Date of Birth: 1966/09/05           MRN: [id number]             PCP: Krystal ClarkBrown-Patram, Melissa Joyce, NP Referring: Rhea BleacherBrown-Patram, Melissa J* Visit Date: 03/01/2022 Occupation: @GUAROCC @  Subjective:  Pain in both knees   History of Present Illness: Laura Barrett is a 56 y.o. female with history of seropositive rheumatoid arthritis.  She is taking methotrexate 8 tablets by mouth once weekly and folic acid 2 mg daily.  She is tolerating methotrexate without any side effects and has not missed any doses recently.  She denies any signs or symptoms of a rheumatoid arthritis flare.  She noticed improvement after her last Visco gel injections in October 2022.  Her knee joint pain has started to return.  She experiences intermittent buckling and stiffness in both knees.  She denies any warmth in her knees at this time.  She is ready to proceed with the Euflexxa series starting today.  She will take ibuprofen very sparingly for symptomatic relief.  She has not found any benefit taking Tylenol.  She is no longer taking meloxicam.  She has occasional discomfort in her hips.  She denies any other joint pain or joint swelling at this time. She denies any recent infections.   Activities of Daily Living:  Patient reports morning stiffness for 3 hours.   Patient Reports nocturnal pain.  Difficulty dressing/grooming: Denies Difficulty climbing stairs: Reports Difficulty getting out of chair: Reports Difficulty using hands for taps, buttons, cutlery, and/or writing: Reports  Review of Systems  Constitutional:  Negative for fatigue.  HENT:  Negative for mouth dryness.   Eyes:  Negative for dryness.  Respiratory:  Negative for shortness of breath.   Cardiovascular:  Negative for swelling in legs/feet.  Gastrointestinal:  Negative for constipation.  Endocrine: Positive for heat intolerance.  Genitourinary:  Negative for difficulty urinating.   Musculoskeletal:  Positive for joint pain, joint pain and morning stiffness.  Skin:  Negative for rash.  Allergic/Immunologic: Negative for susceptible to infections.  Neurological:  Negative for numbness.  Hematological:  Positive for bruising/bleeding tendency.  Psychiatric/Behavioral:  Positive for sleep disturbance.    PMFS History:  Patient Active Problem List   Diagnosis Date Noted  . Urge incontinence 04/08/2021  . Primary insomnia 04/08/2021  . Prediabetes 04/08/2021  . Acquired hypothyroidism 04/08/2021  . History of diverticulosis 04/08/2021  . Essential hypertension 04/08/2021  . Primary osteoarthritis of both knees 04/08/2021  . High risk medication use 04/08/2021    Past Medical History:  Diagnosis Date  . Osteoarthritis   . Rheumatoid arthritis (HCC)     Family History  Problem Relation Age of Onset  . Hypertension Mother   . Stroke Mother   . Hypertension Father   . Rheum arthritis Sister    Past Surgical History:  Procedure Laterality Date  . BOWEL RESECTION  08/2012  . CHOLECYSTECTOMY  01/2005  . ILEOSTOMY     & reversal   Social History   Social History Narrative  . Not on file   Immunization History  Administered Date(s) Administered  . PFIZER(Purple Top)SARS-COV-2 Vaccination 12/29/2019, 01/26/2020, 06/25/2020, 02/04/2021     Objective: Vital Signs: BP 137/86 (BP Location: Left Arm, Patient Position: Sitting, Cuff Size: Normal)   Pulse 70   Resp 16   Ht 5\' 2"  (1.575 m)   Wt  229 lb (103.9 kg)   BMI 41.88 kg/m    Physical Exam Vitals and nursing note reviewed.  Constitutional:      Appearance: She is well-developed.  HENT:     Head: Normocephalic and atraumatic.  Eyes:     Conjunctiva/sclera: Conjunctivae normal.  Cardiovascular:     Rate and Rhythm: Normal rate and regular rhythm.     Heart sounds: Normal heart sounds.  Pulmonary:     Effort: Pulmonary effort is normal.     Breath sounds: Normal breath sounds.  Abdominal:      General: Bowel sounds are normal.     Palpations: Abdomen is soft.  Musculoskeletal:     Cervical back: Normal range of motion.  Skin:    General: Skin is warm and dry.     Capillary Refill: Capillary refill takes less than 2 seconds.  Neurological:     Mental Status: She is alert and oriented to person, place, and time.  Psychiatric:        Behavior: Behavior normal.     Musculoskeletal Exam: C-spine, thoracic spine, and lumbar spine good ROM.  Shoulder joints, elbow joints, wrist joints, MCPs, PIPs, and DIPs have good ROM with no synovitis.  Complete fist formation bilaterally.  Hip joints, knee joints, and ankle joints have good ROM with no discomfort.  No warmth or effusion of knee joints.  No tenderness or swelling of ankle joints.  CDAI Exam: CDAI Score: 0.8  Patient Global: 4 mm; Provider Global: 4 mm Swollen: 0 ; Tender: 0  Joint Exam 03/01/2022   No joint exam has been documented for this visit   There is currently no information documented on the homunculus. Go to the Rheumatology activity and complete the homunculus joint exam.  Investigation: No additional findings.  Imaging: No results found.  Recent Labs: Lab Results  Component Value Date   WBC 9.5 12/01/2021   HGB 14.5 12/01/2021   PLT 280 12/01/2021   NA 139 12/01/2021   K 4.2 12/01/2021   CL 106 12/01/2021   CO2 25 12/01/2021   GLUCOSE 99 12/01/2021   BUN 13 12/01/2021   CREATININE 0.76 12/01/2021   BILITOT 0.6 12/01/2021   AST 22 12/01/2021   ALT 26 12/01/2021   PROT 6.9 12/01/2021   CALCIUM 9.1 12/01/2021   QFTBGOLDPLUS NEGATIVE 04/08/2021    Speciality Comments: Humira x 24months-discontinued 03/22 due to inadequate response.  Schedule appointment every 3 months for compliance  Procedures:  Large Joint Inj: bilateral knee on 03/01/2022 10:35 AM Indications: pain Details: 27 G 1.5 in needle, medial approach  Arthrogram: No  Medications (Right): 1.5 mL lidocaine 1 %; 20 mg Sodium  Hyaluronate 20 MG/2ML Aspirate (Right): 0 mL Medications (Left): 1.5 mL lidocaine 1 %; 20 mg Sodium Hyaluronate 20 MG/2ML Aspirate (Left): 0 mL Outcome: tolerated well, no immediate complications Procedure, treatment alternatives, risks and benefits explained, specific risks discussed. Consent was given by the patient. Immediately prior to procedure a time out was called to verify the correct patient, procedure, equipment, support staff and site/side marked as required. Patient was prepped and draped in the usual sterile fashion.    Allergies: Ramipril   Assessment / Plan:     Visit Diagnoses: Rheumatoid arthritis with rheumatoid factor of multiple sites without organ or systems involvement [facility]): She has no joint tenderness or synovitis on examination today.  She has not had any signs or symptoms of a rheumatoid arthritis flare.  She has clinically been doing well taking methotrexate  8 tablets by mouth once weekly and folic acid 2 mg daily.  She has been tolerating methotrexate without any side effects or recurrent infections.  She presents today with increased discomfort and stiffness in both knees.  On examination she has good range of motion of both knee joints with no warmth or effusion.  She received the first Euflexxa injection in both knees today.  She tolerated procedure well.  Aftercare was discussed. Overall her rheumatoid arthritis is well controlled on the current treatment regimen.  No medication changes will be made at this time.  She was advised to notify us if she develops increased joint pain or joint swelling.  She will follow-up in the office in 3 months or sooner if needed.  High risk medication use -Methotrexate 8 tablets by mouth once weekly and folic acid 2 mg daily.  She is tolerating methotrexate without any side effects.  CBC and CMP WNL on 12/01/21.  Order for CBC and CMP released today. Work will be due in August and every 3 months to monitor for drug toxicity.  Standing  orders for CBC and CMP remain in place. plan: CBC with Differential/Platelet, COMPLETE METABOLIC PANEL WITH GFR Discussed the importance of holding methotrexate if she develops signs or symptoms of an infection and to resume once the infection has completely cleared.    Pain in both hands: X-rays of both hands were unremarkable on 04/08/2021.  She has no tenderness or synovitis on examination today.  She is able to make a complete fist bilaterally.  Lateral epicondylitis, right elbow: Resolved  Primary osteoarthritis of both knees: X-rays of the right knee were consistent with mild osteoarthritis and mild chondromalacia patella.  X-rays of the left knee were unremarkable on 04/08/2021.  She has good range of motion of both knee joints on examination today.  No warmth or effusion was noted.  She presents today with increased discomfort in both knee joints.  She noticed improvement in her knee joint pain and stiffness after undergoing Visco gel injections in October 2022 but her symptoms have started to return.  She presents today for the first injection of the Euflexxa series.  She tolerated the procedures well.  Procedure notes were completed above.  Aftercare was discussed.  She plans on returning for the next 2 injections of the series and has the appointment scheduled accordingly.  Primary osteoarthritis of both feet: She is not experiencing any increased discomfort in her feet at this time.  No tenderness over MTP joints.  Both ankle joints have good range of motion with no tenderness or inflammation.  DDD (degenerative disc disease), cervical: C-spine has good range of motion with no discomfort.  Other medical conditions are listed as follows:  Essential hypertension: Blood pressure was 137/86 today in the office.  Prediabetes  Acquired hypothyroidism  Urge incontinence  History of diverticulosis  Primary insomnia  Family history of rheumatoid arthritis  Orders: Orders Placed This  Encounter  Procedures  . Large Joint Inj: bilateral knee  . CBC with Differential/Platelet  . COMPLETE METABOLIC PANEL WITH GFR   No orders of the defined types were placed in this encounter.    Follow-Up Instructions: Return in about 3 months (around 06/01/2022) for Rheumatoid arthritis.   Gearldine Bienenstock, PA-C  Note - This record has been created using Dragon software.  Chart creation errors have been sought, but may not always  have been located. Such creation errors do not reflect on  the standard of medical care.

## 2022-03-01 ENCOUNTER — Encounter: Payer: Self-pay | Admitting: Physician Assistant

## 2022-03-01 ENCOUNTER — Ambulatory Visit: Payer: Managed Care, Other (non HMO) | Admitting: Physician Assistant

## 2022-03-01 VITALS — BP 137/86 | HR 70 | Resp 16 | Ht 62.0 in | Wt 229.0 lb

## 2022-03-01 DIAGNOSIS — E039 Hypothyroidism, unspecified: Secondary | ICD-10-CM

## 2022-03-01 DIAGNOSIS — M0579 Rheumatoid arthritis with rheumatoid factor of multiple sites without organ or systems involvement: Secondary | ICD-10-CM | POA: Diagnosis not present

## 2022-03-01 DIAGNOSIS — M79641 Pain in right hand: Secondary | ICD-10-CM | POA: Diagnosis not present

## 2022-03-01 DIAGNOSIS — Z8261 Family history of arthritis: Secondary | ICD-10-CM

## 2022-03-01 DIAGNOSIS — M170 Bilateral primary osteoarthritis of knee: Secondary | ICD-10-CM

## 2022-03-01 DIAGNOSIS — R7303 Prediabetes: Secondary | ICD-10-CM

## 2022-03-01 DIAGNOSIS — N3941 Urge incontinence: Secondary | ICD-10-CM

## 2022-03-01 DIAGNOSIS — Z79899 Other long term (current) drug therapy: Secondary | ICD-10-CM | POA: Diagnosis not present

## 2022-03-01 DIAGNOSIS — M19072 Primary osteoarthritis, left ankle and foot: Secondary | ICD-10-CM

## 2022-03-01 DIAGNOSIS — M7711 Lateral epicondylitis, right elbow: Secondary | ICD-10-CM | POA: Diagnosis not present

## 2022-03-01 DIAGNOSIS — F5101 Primary insomnia: Secondary | ICD-10-CM

## 2022-03-01 DIAGNOSIS — Z8719 Personal history of other diseases of the digestive system: Secondary | ICD-10-CM

## 2022-03-01 DIAGNOSIS — I10 Essential (primary) hypertension: Secondary | ICD-10-CM

## 2022-03-01 DIAGNOSIS — M19071 Primary osteoarthritis, right ankle and foot: Secondary | ICD-10-CM

## 2022-03-01 DIAGNOSIS — M79642 Pain in left hand: Secondary | ICD-10-CM

## 2022-03-01 DIAGNOSIS — M5030 Other cervical disc degeneration, unspecified cervical region: Secondary | ICD-10-CM

## 2022-03-01 MED ORDER — SODIUM HYALURONATE (VISCOSUP) 20 MG/2ML IX SOSY
20.0000 mg | PREFILLED_SYRINGE | INTRA_ARTICULAR | Status: AC | PRN
  Administered 2022-03-01: 20 mg via INTRA_ARTICULAR

## 2022-03-01 MED ORDER — LIDOCAINE HCL 1 % IJ SOLN
1.5000 mL | INTRAMUSCULAR | Status: AC | PRN
  Administered 2022-03-01: 1.5 mL

## 2022-03-01 NOTE — Patient Instructions (Addendum)
If you have signs or symptoms of an infection or start antibiotics: First, call your PCP for workup of your infection. Hold your medication through the infection, until you complete your antibiotics, and until symptoms resolve if you take the following: Injectable medication (Actemra, Benlysta, Cimzia, Cosentyx, Enbrel, Humira, Kevzara, Orencia, Remicade, Simponi, Stelara, Taltz, Tremfya) Methotrexate Leflunomide (Arava) Mycophenolate (Cellcept) Harriette Ohara, Olumiant, or Rinvoq   Standing Labs We placed an order today for your standing lab work.   Please have your standing labs drawn in August and every 3 months   If possible, please have your labs drawn 2 weeks prior to your appointment so that the provider can discuss your results at your appointment.  Please note that you may see your imaging and lab results in MyChart before we have reviewed them. We may be awaiting multiple results to interpret others before contacting you. Please allow our office up to 72 hours to thoroughly review all of the results before contacting the office for clarification of your results.  We have open lab daily: Monday through Thursday from 1:30-4:30 PM and Friday from 1:30-4:00 PM at the office of Dr. Pollyann Savoy, [facility].   Please be advised, all patients with office appointments requiring lab work will take precedent over walk-in lab work.  If possible, please come for your lab work on Monday and Friday afternoons, as you may experience shorter wait times. The office is located at [address], [place], [place] [address] No appointment is necessary.   Labs are drawn by [facility]. Please bring your co-pay at the time of your lab draw.  You may receive a bill from [facility] for your lab work.  Please note if you are on Hydroxychloroquine and and an order has been placed for a Hydroxychloroquine level, you will need to have it drawn 4 hours or more after your last dose.  If you  wish to have your labs drawn at another location, please call the office 24 hours in advance to send orders.  If you have any questions regarding directions or hours of operation,  please call [phone].   As a reminder, please drink plenty of water prior to coming for your lab work. Thanks!

## 2022-03-02 ENCOUNTER — Ambulatory Visit: Payer: Managed Care, Other (non HMO) | Admitting: Physician Assistant

## 2022-03-02 DIAGNOSIS — I10 Essential (primary) hypertension: Secondary | ICD-10-CM

## 2022-03-02 DIAGNOSIS — F5101 Primary insomnia: Secondary | ICD-10-CM

## 2022-03-02 DIAGNOSIS — R7303 Prediabetes: Secondary | ICD-10-CM

## 2022-03-02 DIAGNOSIS — M170 Bilateral primary osteoarthritis of knee: Secondary | ICD-10-CM

## 2022-03-02 DIAGNOSIS — M5030 Other cervical disc degeneration, unspecified cervical region: Secondary | ICD-10-CM

## 2022-03-02 DIAGNOSIS — U071 COVID-19: Secondary | ICD-10-CM

## 2022-03-02 DIAGNOSIS — N3941 Urge incontinence: Secondary | ICD-10-CM

## 2022-03-02 DIAGNOSIS — Z8261 Family history of arthritis: Secondary | ICD-10-CM

## 2022-03-02 DIAGNOSIS — M19072 Primary osteoarthritis, left ankle and foot: Secondary | ICD-10-CM

## 2022-03-02 DIAGNOSIS — Z8719 Personal history of other diseases of the digestive system: Secondary | ICD-10-CM

## 2022-03-02 DIAGNOSIS — M7711 Lateral epicondylitis, right elbow: Secondary | ICD-10-CM

## 2022-03-02 DIAGNOSIS — E039 Hypothyroidism, unspecified: Secondary | ICD-10-CM

## 2022-03-02 DIAGNOSIS — M79641 Pain in right hand: Secondary | ICD-10-CM

## 2022-03-02 DIAGNOSIS — Z79899 Other long term (current) drug therapy: Secondary | ICD-10-CM

## 2022-03-02 DIAGNOSIS — M0579 Rheumatoid arthritis with rheumatoid factor of multiple sites without organ or systems involvement: Secondary | ICD-10-CM

## 2022-03-02 LAB — COMPLETE METABOLIC PANEL WITH GFR
AG Ratio: 1.5 (calc) (ref 1.0–2.5)
ALT: 24 U/L (ref 6–29)
AST: 21 U/L (ref 10–35)
Albumin: 4 g/dL (ref 3.6–5.1)
Alkaline phosphatase (APISO): 90 U/L (ref 37–153)
BUN: 14 mg/dL (ref 7–25)
CO2: 22 mmol/L (ref 20–32)
Calcium: 8.9 mg/dL (ref 8.6–10.4)
Chloride: 108 mmol/L (ref 98–110)
Creat: 0.77 mg/dL (ref 0.50–1.03)
Globulin: 2.6 g/dL (calc) (ref 1.9–3.7)
Glucose, Bld: 125 mg/dL — ABNORMAL HIGH (ref 65–99)
Potassium: 4.5 mmol/L (ref 3.5–5.3)
Sodium: 139 mmol/L (ref 135–146)
Total Bilirubin: 0.8 mg/dL (ref 0.2–1.2)
Total Protein: 6.6 g/dL (ref 6.1–8.1)
eGFR: 92 mL/min/{1.73_m2} (ref >=60)

## 2022-03-02 LAB — CBC WITH DIFFERENTIAL/PLATELET
Absolute Monocytes: 539 cells/uL (ref 200–950)
Basophils Absolute: 44 cells/uL (ref 0–200)
Basophils Relative: 0.5 %
Eosinophils Absolute: 191 cells/uL (ref 15–500)
Eosinophils Relative: 2.2 %
HCT: 43.7 % (ref 35.0–45.0)
Hemoglobin: 14.5 g/dL (ref 11.7–15.5)
Lymphs Abs: 2079 cells/uL (ref 850–3900)
MCH: 30.1 pg (ref 27.0–33.0)
MCHC: 33.2 g/dL (ref 32.0–36.0)
MCV: 90.9 fL (ref 80.0–100.0)
MPV: 11.5 fL (ref 7.5–12.5)
Monocytes Relative: 6.2 %
Neutro Abs: 5846 cells/uL (ref 1500–7800)
Neutrophils Relative %: 67.2 %
Platelets: 281 10*3/uL (ref 140–400)
RBC: 4.81 10*6/uL (ref 3.80–5.10)
RDW: 14 % (ref 11.0–15.0)
Total Lymphocyte: 23.9 %
WBC: 8.7 10*3/uL (ref 3.8–10.8)

## 2022-03-02 NOTE — Progress Notes (Signed)
Glucose is 125.  Rest of CMP WNL. CBC WNL.

## 2022-03-07 ENCOUNTER — Ambulatory Visit: Payer: Managed Care, Other (non HMO) | Admitting: Physician Assistant

## 2022-03-07 DIAGNOSIS — M170 Bilateral primary osteoarthritis of knee: Secondary | ICD-10-CM | POA: Diagnosis not present

## 2022-03-07 MED ORDER — SODIUM HYALURONATE (VISCOSUP) 20 MG/2ML IX SOSY
20.0000 mg | PREFILLED_SYRINGE | INTRA_ARTICULAR | Status: AC | PRN
  Administered 2022-03-07: 20 mg via INTRA_ARTICULAR

## 2022-03-07 MED ORDER — LIDOCAINE HCL 1 % IJ SOLN
1.5000 mL | INTRAMUSCULAR | Status: AC | PRN
  Administered 2022-03-07: 1.5 mL via INTRA_ARTICULAR

## 2022-03-07 NOTE — Progress Notes (Signed)
   Procedure Note  Patient: Laura Barrett             Date of Birth: March 24, 1966           MRN: [id number]             Visit Date: 03/07/2022  Procedures: Visit Diagnoses:  1. Primary osteoarthritis of both knees    Euflexxa #2 bilateral knees, B/B Large Joint Inj: bilateral knee on 03/07/2022 8:33 AM Indications: pain Details: 27 G 1.5 in needle, medial approach  Arthrogram: No  Medications (Right): 1.5 mL lidocaine 1 %; 20 mg Sodium Hyaluronate 20 MG/2ML Aspirate (Right): 0 mL Medications (Left): 1.5 mL lidocaine 1 %; 20 mg Sodium Hyaluronate 20 MG/2ML Aspirate (Left): 0 mL Outcome: tolerated well, no immediate complications Procedure, treatment alternatives, risks and benefits explained, specific risks discussed. Consent was given by the patient. Immediately prior to procedure a time out was called to verify the correct patient, procedure, equipment, support staff and site/side marked as required. Patient was prepped and draped in the usual sterile fashion.    Patient tolerated the procedure well.  Aftercare was discussed.  Hazel Sams, PA-C

## 2022-03-14 ENCOUNTER — Ambulatory Visit: Payer: Managed Care, Other (non HMO) | Admitting: Physician Assistant

## 2022-03-14 DIAGNOSIS — M170 Bilateral primary osteoarthritis of knee: Secondary | ICD-10-CM

## 2022-03-14 MED ORDER — LIDOCAINE HCL 1 % IJ SOLN
1.5000 mL | INTRAMUSCULAR | Status: AC | PRN
  Administered 2022-03-14: 1.5 mL

## 2022-03-14 MED ORDER — SODIUM HYALURONATE (VISCOSUP) 20 MG/2ML IX SOSY
20.0000 mg | PREFILLED_SYRINGE | INTRA_ARTICULAR | Status: AC | PRN
  Administered 2022-03-14: 20 mg via INTRA_ARTICULAR

## 2022-03-14 NOTE — Progress Notes (Signed)
   Procedure Note  Patient: Laura Barrett             Date of Birth: November 26, 1965           MRN: [id number]             Visit Date: 03/14/2022  Procedures: Visit Diagnoses:  1. Primary osteoarthritis of both knees    Euflexxa #3 Bilateral knee joint injections  Large Joint Inj: bilateral knee on 03/14/2022 10:08 AM Indications: pain Details: 27 G 1.5 in needle, medial approach  Arthrogram: No  Medications (Right): 1.5 mL lidocaine 1 %; 20 mg Sodium Hyaluronate 20 MG/2ML Aspirate (Right): 0 mL Medications (Left): 1.5 mL lidocaine 1 %; 20 mg Sodium Hyaluronate 20 MG/2ML Aspirate (Left): 0 mL Outcome: tolerated well, no immediate complications Procedure, treatment alternatives, risks and benefits explained, specific risks discussed. Consent was given by the patient. Immediately prior to procedure a time out was called to verify the correct patient, procedure, equipment, support staff and site/side marked as required. Patient was prepped and draped in the usual sterile fashion.    Patient tolerated the procedure well.  Aftercare was discussed.  Sherron Ales, PA-C

## 2022-03-27 ENCOUNTER — Other Ambulatory Visit: Payer: Self-pay | Admitting: Physician Assistant

## 2022-03-27 NOTE — Telephone Encounter (Signed)
Next Visit: 06/01/2022  Last Visit: 03/01/2022  Last Fill: 01/06/2022  DX: Rheumatoid arthritis with rheumatoid factor of multiple sites without organ or systems involvement   Current Dose per office note 03/01/2022: Methotrexate 8 tablets by mouth once weekly   Labs: 03/01/2022, Glucose is 125.  Rest of CMP WNL. CBC WNL.   Okay to refill MTX?

## 2022-04-13 DIAGNOSIS — N959 Unspecified menopausal and perimenopausal disorder: Secondary | ICD-10-CM | POA: Insufficient documentation

## 2022-05-18 NOTE — Progress Notes (Deleted)
Office Visit Note  Patient: Laura Barrett             Date of Birth: Jan 13, 1966           MRN: [id number]             PCP: Krystal Clark, NP Referring: Rhea Bleacher* Visit Date: 06/01/2022 Occupation: @GUAROCC @  Subjective:  No chief complaint on file.   History of Present Illness: Laura Barrett is a 56 y.o. female ***   Activities of Daily Living:  Patient reports morning stiffness for *** {minute/hour:19697}.   Patient {ACTIONS;DENIES/REPORTS:21021675::"Denies"} nocturnal pain.  Difficulty dressing/grooming: {ACTIONS;DENIES/REPORTS:21021675::"Denies"} Difficulty climbing stairs: {ACTIONS;DENIES/REPORTS:21021675::"Denies"} Difficulty getting out of chair: {ACTIONS;DENIES/REPORTS:21021675::"Denies"} Difficulty using hands for taps, buttons, cutlery, and/or writing: {ACTIONS;DENIES/REPORTS:21021675::"Denies"}  No Rheumatology ROS completed.   PMFS History:  Patient Active Problem List   Diagnosis Date Noted  . Urge incontinence 04/08/2021  . Primary insomnia 04/08/2021  . Prediabetes 04/08/2021  . Acquired hypothyroidism 04/08/2021  . History of diverticulosis 04/08/2021  . Essential hypertension 04/08/2021  . Primary osteoarthritis of both knees 04/08/2021  . High risk medication use 04/08/2021    Past Medical History:  Diagnosis Date  . Osteoarthritis   . Rheumatoid arthritis (HCC)     Family History  Problem Relation Age of Onset  . Hypertension Mother   . Stroke Mother   . Hypertension Father   . Rheum arthritis Sister    Past Surgical History:  Procedure Laterality Date  . BOWEL RESECTION  08/2012  . CHOLECYSTECTOMY  01/2005  . ILEOSTOMY     & reversal   Social History   Social History Narrative  . Not on file   Immunization History  Administered Date(s) Administered  . PFIZER(Purple Top)SARS-COV-2 Vaccination 12/29/2019, 01/26/2020, 06/25/2020, 02/04/2021     Objective: Vital Signs: There were no vitals taken  for this visit.   Physical Exam   Musculoskeletal Exam: ***  CDAI Exam: CDAI Score: -- Patient Global: --; Provider Global: -- Swollen: --; Tender: -- Joint Exam 06/01/2022   No joint exam has been documented for this visit   There is currently no information documented on the homunculus. Go to the Rheumatology activity and complete the homunculus joint exam.  Investigation: No additional findings.  Imaging: No results found.  Recent Labs: Lab Results  Component Value Date   WBC 8.7 03/01/2022   HGB 14.5 03/01/2022   PLT 281 03/01/2022   NA 139 03/01/2022   K 4.5 03/01/2022   CL 108 03/01/2022   CO2 22 03/01/2022   GLUCOSE 125 (H) 03/01/2022   BUN 14 03/01/2022   CREATININE 0.77 03/01/2022   BILITOT 0.8 03/01/2022   AST 21 03/01/2022   ALT 24 03/01/2022   PROT 6.6 03/01/2022   CALCIUM 8.9 03/01/2022   QFTBGOLDPLUS NEGATIVE 04/08/2021    Speciality Comments: Humira x 58months-discontinued 03/22 due to inadequate response.  Schedule appointment every 3 months for compliance  Procedures:  No procedures performed Allergies: Ramipril   Assessment / Plan:     Visit Diagnoses: No diagnosis found.  Orders: No orders of the defined types were placed in this encounter.  No orders of the defined types were placed in this encounter.   Face-to-face time spent with patient was *** minutes. Greater than 50% of time was spent in counseling and coordination of care.  Follow-Up Instructions: No follow-ups on file.   4/22, CMA  Note - This record has been created using Ellen Henri.  Chart creation  errors have been sought, but may not always  have been located. Such creation errors do not reflect on  the standard of medical care.

## 2022-05-24 NOTE — Progress Notes (Unsigned)
Office Visit Note  Patient: Laura Barrett             Date of Birth: Jun 10, 1966           MRN: [id number]             PCP: Krystal Clark, NP Referring: Rhea Bleacher* Visit Date: 05/29/2022 Occupation: @GUAROCC @  Subjective:  Pain in both thumbs   History of Present Illness: Adrianne Shackleton is a 56 y.o. female with history seropositive rheumatoid arthritis and osteoarthritis.  She is taking methotrexate 8 tablets by mouth once weekly and folic acid 2 mg daily.  She is tolerating combination therapy without any side effects.  She denies any signs or symptoms of a rheumatoid arthritis flare.  She has occasional arthralgias which tend to be migratory but has not noticed any inflammation.  She states overall her knee joints are doing well after undergoing visco gel injections in both knees in May/June 2023.  She denies any joint swelling at this time.  She uses Biofreeze topically as needed for symptomatic relief.  She is currently having pain in both thumbs but denies any swelling or warmth at this time.    Activities of Daily Living:  Patient reports morning stiffness for 3 hours.   Patient Reports nocturnal pain.  Difficulty dressing/grooming: Denies Difficulty climbing stairs: Reports Difficulty getting out of chair: Reports Difficulty using hands for taps, buttons, cutlery, and/or writing: Reports  Review of Systems  Constitutional:  Positive for fatigue.  HENT:  Positive for mouth dryness. Negative for mouth sores.   Eyes:  Negative for dryness.  Respiratory:  Negative for shortness of breath.   Cardiovascular:  Negative for chest pain and palpitations.  Gastrointestinal:  Negative for blood in stool, constipation and diarrhea.  Endocrine: Negative for increased urination.  Genitourinary:  Negative for involuntary urination.  Musculoskeletal:  Positive for joint pain, joint pain, joint swelling and morning stiffness. Negative for gait problem,  myalgias, muscle weakness, muscle tenderness and myalgias.  Skin:  Positive for sensitivity to sunlight. Negative for color change, rash and hair loss.  Allergic/Immunologic: Negative for susceptible to infections.  Neurological:  Negative for dizziness and headaches.  Hematological:  Negative for swollen glands.  Psychiatric/Behavioral:  Positive for depressed mood and sleep disturbance. The patient is nervous/anxious.     PMFS History:  Patient Active Problem List   Diagnosis Date Noted   Urge incontinence 04/08/2021   Primary insomnia 04/08/2021   Prediabetes 04/08/2021   Acquired hypothyroidism 04/08/2021   History of diverticulosis 04/08/2021   Essential hypertension 04/08/2021   Primary osteoarthritis of both knees 04/08/2021   High risk medication use 04/08/2021    Past Medical History:  Diagnosis Date   Osteoarthritis    Rheumatoid arthritis (HCC)     Family History  Problem Relation Age of Onset   Hypertension Mother    Stroke Mother    Hypertension Father    Rheum arthritis Sister    Past Surgical History:  Procedure Laterality Date   BOWEL RESECTION  08/2012   CHOLECYSTECTOMY  01/2005   ILEOSTOMY     & reversal   Social History   Social History Narrative   Not on file   Immunization History  Administered Date(s) Administered   PFIZER(Purple Top)SARS-COV-2 Vaccination 12/29/2019, 01/26/2020, 06/25/2020, 02/04/2021     Objective: Vital Signs: BP 127/81 (BP Location: Left Arm, Patient Position: Sitting, Cuff Size: Normal)   Pulse 68   Resp 15   Ht 5'  2" (1.575 m)   Wt 229 lb (103.9 kg)   BMI 41.88 kg/m    Physical Exam Vitals and nursing note reviewed.  Constitutional:      Appearance: She is well-developed.  HENT:     Head: Normocephalic and atraumatic.  Eyes:     Conjunctiva/sclera: Conjunctivae normal.  Cardiovascular:     Rate and Rhythm: Normal rate and regular rhythm.     Heart sounds: Normal heart sounds.  Pulmonary:     Effort:  Pulmonary effort is normal.     Breath sounds: Normal breath sounds.  Abdominal:     General: Bowel sounds are normal.     Palpations: Abdomen is soft.  Musculoskeletal:     Cervical back: Normal range of motion.  Skin:    General: Skin is warm and dry.     Capillary Refill: Capillary refill takes less than 2 seconds.  Neurological:     Mental Status: She is alert and oriented to person, place, and time.  Psychiatric:        Behavior: Behavior normal.      Musculoskeletal Exam: C-spine has limited range of motion without rotation.  No midline spinal tenderness.  She has tenderness over the left SI joint.  Shoulder joints have good range of motion with no discomfort.  Elbow joints, wrist joints, MCPs, PIPs, DIPs have good range of motion with no synovitis.  Tenderness over bilateral first PIP joints.  Complete fist formation bilaterally.  Hip joints have good range of motion with no groin pain.  Knee joints have good range of motion with no warmth or effusion.  Ankle joints have good range of motion with no tenderness or joint swelling.  CDAI Exam: CDAI Score: 3  Patient Global: 5 mm; Provider Global: 5 mm Swollen: 0 ; Tender: 2  Joint Exam 05/29/2022      Right  Left  IP   Tender   Tender     Investigation: No additional findings.  Imaging: No results found.  Recent Labs: Lab Results  Component Value Date   WBC 8.7 03/01/2022   HGB 14.5 03/01/2022   PLT 281 03/01/2022   NA 139 03/01/2022   K 4.5 03/01/2022   CL 108 03/01/2022   CO2 22 03/01/2022   GLUCOSE 125 (H) 03/01/2022   BUN 14 03/01/2022   CREATININE 0.77 03/01/2022   BILITOT 0.8 03/01/2022   AST 21 03/01/2022   ALT 24 03/01/2022   PROT 6.6 03/01/2022   CALCIUM 8.9 03/01/2022   QFTBGOLDPLUS NEGATIVE 04/08/2021    Speciality Comments: Humira x 85months-discontinued 03/22 due to inadequate response.  Schedule appointment every 3 months for compliance  Procedures:  No procedures performed Allergies:  Ramipril   Assessment / Plan:     Visit Diagnoses: Rheumatoid arthritis with rheumatoid factor of multiple sites without organ or systems involvement [facility]): She has no synovitis on examination today.  She has not had any signs or symptoms of a rheumatoid arthritis flare.  She is clinically doing well taking methotrexate 8 tablets by mouth once weekly and folic acid 2 mg daily.  She continues to have migratory arthralgias but no inflammation.  On examination today she has tenderness over bilateral first PIP joints but no active inflammation was noted.  She was able to make a complete fist bilaterally.  Discussed the use of Voltaren gel which she can apply topically as needed for symptomatic relief.  She will remain on methotrexate and folic acid as prescribed.  A refill folic  acid was sent to the pharmacy today.  She was advised to notify us if she develops signs or symptoms of a flare.  She will follow-up in the office in 3 months or sooner if needed.  High risk medication use - Methotrexate 8 tablets by mouth once weekly and folic acid 2 mg daily.  - Plan: CBC with Differential/Platelet, COMPLETE METABOLIC PANEL WITH GFR, CBC with Differential/Platelet, COMPLETE METABOLIC PANEL WITH GFR CBC WNL on 03/01/22. CMP updated on 04/13/22.  Orders for CBC and CMP released today.  Her next lab work will be due in November and every 3 months.  Standing orders for CBC and CMP will be placed today. She has not had any recent or recurrent infections.  Discussed the importance of holding methotrexate if she develops signs or symptoms of an infection and to resume once the infection has completely cleared.   Pain in both hands - X-rays of both hands were unremarkable on 04/08/2021.  No tenderness or synovitis over MCP joints.  She has tenderness over bilateral first PIP joints but no active inflammation noted.  She was able to make a complete fist bilaterally.  Discussed the use of Voltaren gel which she can apply topically  as needed for pain relief.  Discussed the importance of joint protection and muscle strengthening.  Lateral epicondylitis, right elbow - Resolved.   Primary osteoarthritis of both knees - X-rays of the right knee were consistent with mild osteoarthritis and mild chondromalacia patella.  X-rays of the left knee were unremarkable on 04/08/2021.  She had Visco gel injections in both knees in May/June 2023 which have provided significant relief.  On examination she has good range of motion of both knee joints with no warmth or effusion.  Primary osteoarthritis of both feet: She is not experiencing any discomfort in her feet at this time.  She has good range of motion of both ankle joints with no tenderness or synovitis.  DDD (degenerative disc disease), cervical: C-spine has slightly limited range of motion with lateral rotation.  She has some trapezius muscle tension and tenderness bilaterally.  She has occasional symptoms of radiculopathy.  She was advised to notify us if her symptoms persist or worsen at which time we can obtain further imaging or refer her to a spine specialist.  Other medical conditions are listed as follows:  Essential hypertension: Blood pressure was 127/81 today in the office.  Prediabetes  Urge incontinence  Acquired hypothyroidism  History of diverticulosis  Primary insomnia  Family history of rheumatoid arthritis  Orders: Orders Placed This Encounter  Procedures   CBC with Differential/Platelet   COMPLETE METABOLIC PANEL WITH GFR   CBC with Differential/Platelet   COMPLETE METABOLIC PANEL WITH GFR   Meds ordered this encounter  Medications   folic acid (FOLVITE) 1 MG tablet    Sig: TAKE 2 TABLETS BY MOUTH EVERY DAY    Dispense:  180 tablet    Refill:  3     Follow-Up Instructions: Return in about 3 months (around 08/29/2022) for Rheumatoid arthritis, Osteoarthritis.   Gearldine Bienenstock, PA-C  Note - This record has been created using Dragon software.   Chart creation errors have been sought, but may not always  have been located. Such creation errors do not reflect on  the standard of medical care.

## 2022-05-29 ENCOUNTER — Ambulatory Visit: Payer: Managed Care, Other (non HMO) | Attending: Rheumatology | Admitting: Physician Assistant

## 2022-05-29 ENCOUNTER — Encounter: Payer: Self-pay | Admitting: Physician Assistant

## 2022-05-29 VITALS — BP 127/81 | HR 68 | Resp 15 | Ht 62.0 in | Wt 229.0 lb

## 2022-05-29 DIAGNOSIS — M79642 Pain in left hand: Secondary | ICD-10-CM

## 2022-05-29 DIAGNOSIS — M79641 Pain in right hand: Secondary | ICD-10-CM | POA: Diagnosis not present

## 2022-05-29 DIAGNOSIS — Z8261 Family history of arthritis: Secondary | ICD-10-CM

## 2022-05-29 DIAGNOSIS — F5101 Primary insomnia: Secondary | ICD-10-CM

## 2022-05-29 DIAGNOSIS — Z79899 Other long term (current) drug therapy: Secondary | ICD-10-CM

## 2022-05-29 DIAGNOSIS — M0579 Rheumatoid arthritis with rheumatoid factor of multiple sites without organ or systems involvement: Secondary | ICD-10-CM | POA: Diagnosis not present

## 2022-05-29 DIAGNOSIS — Z8719 Personal history of other diseases of the digestive system: Secondary | ICD-10-CM

## 2022-05-29 DIAGNOSIS — M19071 Primary osteoarthritis, right ankle and foot: Secondary | ICD-10-CM | POA: Diagnosis not present

## 2022-05-29 DIAGNOSIS — M5030 Other cervical disc degeneration, unspecified cervical region: Secondary | ICD-10-CM

## 2022-05-29 DIAGNOSIS — M7711 Lateral epicondylitis, right elbow: Secondary | ICD-10-CM

## 2022-05-29 DIAGNOSIS — M170 Bilateral primary osteoarthritis of knee: Secondary | ICD-10-CM

## 2022-05-29 DIAGNOSIS — E039 Hypothyroidism, unspecified: Secondary | ICD-10-CM

## 2022-05-29 DIAGNOSIS — M19072 Primary osteoarthritis, left ankle and foot: Secondary | ICD-10-CM

## 2022-05-29 DIAGNOSIS — N3941 Urge incontinence: Secondary | ICD-10-CM

## 2022-05-29 DIAGNOSIS — R7303 Prediabetes: Secondary | ICD-10-CM

## 2022-05-29 DIAGNOSIS — I10 Essential (primary) hypertension: Secondary | ICD-10-CM

## 2022-05-29 MED ORDER — FOLIC ACID 1 MG PO TABS: ORAL_TABLET | ORAL | 3 refills | Status: DC

## 2022-05-29 NOTE — Patient Instructions (Signed)
Standing Labs We placed an order today for your standing lab work.   Please have your standing labs drawn in November and every 3 months   If possible, please have your labs drawn 2 weeks prior to your appointment so that the provider can discuss your results at your appointment.  Please note that you may see your imaging and lab results in MyChart before we have reviewed them. We may be awaiting multiple results to interpret others before contacting you. Please allow our office up to 72 hours to thoroughly review all of the results before contacting the office for clarification of your results.  We currently have open lab daily: Monday through Thursday from 1:30 PM-4:30 PM and Friday from 1:30 PM- 4:00 PM If possible, please come for your lab work on Monday, Thursday or Friday afternoons, as you may experience shorter wait times.   Effective August 09, 2022 the new lab hours will change to: Monday through Thursday from 1:30 PM-5:00 PM and Friday from 8:30 AM-12:00 PM If possible, please come for your lab work on Monday and Thursday afternoons, as you may experience shorter wait times.  Please be advised, all patients with office appointments requiring lab work will take precedent over walk-in lab work.    The office is located at [address], [address], [place], [place] [address] No appointment is necessary.   Labs are drawn by [facility]. Please bring your co-pay at the time of your lab draw.  You may receive a bill from [facility] for your lab work.  Please note if you are on Hydroxychloroquine and and an order has been placed for a Hydroxychloroquine level, you will need to have it drawn 4 hours or more after your last dose.  If you wish to have your labs drawn at another location, please call the office 24 hours in advance to send orders.  If you have any questions regarding directions or hours of operation,  please call [phone].   As a reminder, please drink plenty of water prior  to coming for your lab work. Thanks!  If you have signs or symptoms of an infection or start antibiotics: First, call your PCP for workup of your infection. Hold your medication through the infection, until you complete your antibiotics, and until symptoms resolve if you take the following: Injectable medication (Actemra, Benlysta, Cimzia, Cosentyx, Enbrel, Humira, Kevzara, Orencia, Remicade, Simponi, Stelara, Taltz, Tremfya) Methotrexate Leflunomide (Arava) Mycophenolate (Cellcept) Xeljanz, Olumiant, or Rinvoq   

## 2022-05-30 LAB — CBC WITH DIFFERENTIAL/PLATELET
Absolute Monocytes: 883 cells/uL (ref 200–950)
Basophils Absolute: 48 cells/uL (ref 0–200)
Basophils Relative: 0.5 %
Eosinophils Absolute: 278 cells/uL (ref 15–500)
Eosinophils Relative: 2.9 %
HCT: 45.3 % — ABNORMAL HIGH (ref 35.0–45.0)
Hemoglobin: 15.1 g/dL (ref 11.7–15.5)
Lymphs Abs: 1872 cells/uL (ref 850–3900)
MCH: 30.9 pg (ref 27.0–33.0)
MCHC: 33.3 g/dL (ref 32.0–36.0)
MCV: 92.6 fL (ref 80.0–100.0)
MPV: 11.4 fL (ref 7.5–12.5)
Monocytes Relative: 9.2 %
Neutro Abs: 6518 cells/uL (ref 1500–7800)
Neutrophils Relative %: 67.9 %
Platelets: 251 10*3/uL (ref 140–400)
RBC: 4.89 10*6/uL (ref 3.80–5.10)
RDW: 13.7 % (ref 11.0–15.0)
Total Lymphocyte: 19.5 %
WBC: 9.6 10*3/uL (ref 3.8–10.8)

## 2022-05-30 LAB — COMPLETE METABOLIC PANEL WITH GFR
AG Ratio: 1.4 (calc) (ref 1.0–2.5)
ALT: 16 U/L (ref 6–29)
AST: 19 U/L (ref 10–35)
Albumin: 3.9 g/dL (ref 3.6–5.1)
Alkaline phosphatase (APISO): 98 U/L (ref 37–153)
BUN: 12 mg/dL (ref 7–25)
CO2: 23 mmol/L (ref 20–32)
Calcium: 9 mg/dL (ref 8.6–10.4)
Chloride: 106 mmol/L (ref 98–110)
Creat: 0.67 mg/dL (ref 0.50–1.03)
Globulin: 2.7 g/dL (calc) (ref 1.9–3.7)
Glucose, Bld: 147 mg/dL — ABNORMAL HIGH (ref 65–99)
Potassium: 4.4 mmol/L (ref 3.5–5.3)
Sodium: 139 mmol/L (ref 135–146)
Total Bilirubin: 0.3 mg/dL (ref 0.2–1.2)
Total Protein: 6.6 g/dL (ref 6.1–8.1)
eGFR: 103 mL/min/{1.73_m2} (ref >=60)

## 2022-05-30 NOTE — Progress Notes (Signed)
Glucose is elevated-147. Rest of CMP WNL.   CBC WNL.

## 2022-06-01 ENCOUNTER — Ambulatory Visit: Payer: Managed Care, Other (non HMO) | Admitting: Rheumatology

## 2022-06-01 DIAGNOSIS — M19071 Primary osteoarthritis, right ankle and foot: Secondary | ICD-10-CM

## 2022-06-01 DIAGNOSIS — M7711 Lateral epicondylitis, right elbow: Secondary | ICD-10-CM

## 2022-06-01 DIAGNOSIS — N3941 Urge incontinence: Secondary | ICD-10-CM

## 2022-06-01 DIAGNOSIS — R7303 Prediabetes: Secondary | ICD-10-CM

## 2022-06-01 DIAGNOSIS — Z8719 Personal history of other diseases of the digestive system: Secondary | ICD-10-CM

## 2022-06-01 DIAGNOSIS — E039 Hypothyroidism, unspecified: Secondary | ICD-10-CM

## 2022-06-01 DIAGNOSIS — Z79899 Other long term (current) drug therapy: Secondary | ICD-10-CM

## 2022-06-01 DIAGNOSIS — Z8261 Family history of arthritis: Secondary | ICD-10-CM

## 2022-06-01 DIAGNOSIS — I10 Essential (primary) hypertension: Secondary | ICD-10-CM

## 2022-06-01 DIAGNOSIS — M170 Bilateral primary osteoarthritis of knee: Secondary | ICD-10-CM

## 2022-06-01 DIAGNOSIS — M79641 Pain in right hand: Secondary | ICD-10-CM

## 2022-06-01 DIAGNOSIS — M5030 Other cervical disc degeneration, unspecified cervical region: Secondary | ICD-10-CM

## 2022-06-01 DIAGNOSIS — M0579 Rheumatoid arthritis with rheumatoid factor of multiple sites without organ or systems involvement: Secondary | ICD-10-CM

## 2022-06-01 DIAGNOSIS — F5101 Primary insomnia: Secondary | ICD-10-CM

## 2022-06-22 ENCOUNTER — Other Ambulatory Visit: Payer: Self-pay | Admitting: Physician Assistant

## 2022-06-22 NOTE — Telephone Encounter (Signed)
Next Visit: 09/05/2022  Last Visit: 05/29/2022  Last Fill: 03/27/2022  DX: Rheumatoid arthritis with rheumatoid factor of multiple sites without organ or systems involvement   Current Dose per office note 05/29/2022: Methotrexate 8 tablets by mouth once weekly   Labs: 05/29/2022 Glucose is elevated-147. Rest of CMP WNL.   CBC WNL.   Okay to refill MTX?

## 2022-08-22 NOTE — Progress Notes (Signed)
Office Visit Note  Patient: Laura Barrett             Date of Birth: June 14, 1966           MRN: [id number]             PCP: Krystal Clark, NP Referring: Rhea Bleacher* Visit Date: 09/05/2022 Occupation: @GUAROCC @  Subjective:  Neck pain    History of Present Illness: Laura Barrett is a 56 y.o. female with history of seropositive rheumatoid arthritis and osteoarthritis.  She is currently on Methotrexate 8 tablets by mouth once weekly and folic acid 2 mg daily.  She is tolerating methotrexate without any side effects and has not missed any doses recently.  She denies any signs or symptoms of a rheumatoid arthritis flare.  She states that she continues to experience stiffness in both hands and both knee joints but denies any joint swelling.  She would like to reapply for Visco gel injections for both knees which have alleviated her symptoms in the past.  Of note she has been experiencing ongoing pain in her neck.  She states that her symptoms have been consistent on a daily basis for the past 3 months.  She has been having intermittent radiating pain to her left shoulder blade and under her left arm at times.  She is also having some increased trapezius muscle tension and tenderness bilaterally especially on the left side. She denies any recent or recurrent infections.  She is planning on getting the annual flu shot.  She denies any new medical conditions.   Activities of Daily Living:  Patient reports morning stiffness for few hours  Patient Denies nocturnal pain.  Difficulty dressing/grooming: Denies Difficulty climbing stairs: Reports Difficulty getting out of chair: Reports Difficulty using hands for taps, buttons, cutlery, and/or writing: Denies  Review of Systems  Constitutional:  Positive for fatigue.  HENT:  Positive for mouth dryness. Negative for mouth sores and nose dryness.   Eyes:  Negative for pain, visual disturbance and dryness.  Respiratory:   Negative for cough, hemoptysis, shortness of breath and difficulty breathing.   Cardiovascular:  Negative for chest pain, palpitations, hypertension and swelling in legs/feet.  Gastrointestinal:  Negative for blood in stool, constipation and diarrhea.  Endocrine: Negative for increased urination.  Genitourinary:  Negative for painful urination.  Musculoskeletal:  Positive for joint pain, joint pain and morning stiffness. Negative for joint swelling, myalgias, muscle weakness, muscle tenderness and myalgias.  Skin:  Negative for color change, pallor, rash, hair loss, nodules/bumps, skin tightness, ulcers and sensitivity to sunlight.  Neurological:  Negative for dizziness, numbness, headaches and weakness.  Hematological:  Negative for swollen glands.  Psychiatric/Behavioral:  Negative for depressed mood and sleep disturbance. The patient is nervous/anxious.     PMFS History:  Patient Active Problem List   Diagnosis Date Noted   Urge incontinence 04/08/2021   Primary insomnia 04/08/2021   Prediabetes 04/08/2021   Acquired hypothyroidism 04/08/2021   History of diverticulosis 04/08/2021   Essential hypertension 04/08/2021   Primary osteoarthritis of both knees 04/08/2021   High risk medication use 04/08/2021    Past Medical History:  Diagnosis Date   Osteoarthritis    Rheumatoid arthritis (HCC)     Family History  Problem Relation Age of Onset   Hypertension Mother    Stroke Mother    Hypertension Father    Rheum arthritis Sister    Past Surgical History:  Procedure Laterality Date   BOWEL RESECTION  08/2012   CHOLECYSTECTOMY  01/2005   ILEOSTOMY     & reversal   Social History   Social History Narrative   Not on file   Immunization History  Administered Date(s) Administered   PFIZER(Purple Top)SARS-COV-2 Vaccination 12/29/2019, 01/26/2020, 06/25/2020, 02/04/2021     Objective: Vital Signs: BP 139/83 (BP Location: Left Arm, Patient Position: Sitting, Cuff Size:  Normal)   Pulse (!) 56   Resp 15   Ht 5\' 1"  (1.549 m)   Wt 224 lb 12.8 oz (102 kg)   BMI 42.48 kg/m    Physical Exam Vitals and nursing note reviewed.  Constitutional:      Appearance: She is well-developed.  HENT:     Head: Normocephalic and atraumatic.  Eyes:     Conjunctiva/sclera: Conjunctivae normal.  Cardiovascular:     Rate and Rhythm: Normal rate and regular rhythm.     Heart sounds: Normal heart sounds.  Pulmonary:     Effort: Pulmonary effort is normal.     Breath sounds: Normal breath sounds.  Abdominal:     General: Bowel sounds are normal.     Palpations: Abdomen is soft.  Musculoskeletal:     Cervical back: Normal range of motion.  Skin:    General: Skin is warm and dry.     Capillary Refill: Capillary refill takes less than 2 seconds.  Neurological:     Mental Status: She is alert and oriented to person, place, and time.  Psychiatric:        Behavior: Behavior normal.      Musculoskeletal Exam: C-spine has limited ROM with lateral rotation to the left.  Shoulder joints, elbow joints, wrist joints, MCPs, PIPs, and DIPs good ROM with no synovitis.  Complete fist formation bilaterally. Complete fist formation bilaterally.  Hip joints have good ROM with no groin pain.  Knee joints have good ROM.  Tenderness over the right pes anserine bursa.  Ankle joints have good ROM with no tenderness or joint swelling.   CDAI Exam: CDAI Score: -- Patient Global: 2 mm; Provider Global: 2 mm Swollen: --; Tender: -- Joint Exam 09/05/2022   No joint exam has been documented for this visit   There is currently no information documented on the homunculus. Go to the Rheumatology activity and complete the homunculus joint exam.  Investigation: No additional findings.  Imaging: XR Cervical Spine 2 or 3 views  Result Date: 09/05/2022 Multilevel spondylosis was noted.  Anterior osteophytes were noted.  Significant narrowing was noted between C5-C6 and C6-C7.  Facet joint  arthropathy was noted. Impression: These findings are consistent with multilevel spondylosis and facet joint arthropathy.   Recent Labs: Lab Results  Component Value Date   WBC 9.6 05/29/2022   HGB 15.1 05/29/2022   PLT 251 05/29/2022   NA 139 05/29/2022   K 4.4 05/29/2022   CL 106 05/29/2022   CO2 23 05/29/2022   GLUCOSE 147 (H) 05/29/2022   BUN 12 05/29/2022   CREATININE 0.67 05/29/2022   BILITOT 0.3 05/29/2022   AST 19 05/29/2022   ALT 16 05/29/2022   PROT 6.6 05/29/2022   CALCIUM 9.0 05/29/2022   QFTBGOLDPLUS NEGATIVE 04/08/2021    Speciality Comments: Humira x 66months-discontinued 03/22 due to inadequate response.  Schedule appointment every 3 months for compliance  Procedures:  Trigger Point Inj  Date/Time: 09/05/2022 10:05 AM  Performed by: 09/07/2022, PA-C Authorized by: Gearldine Bienenstock, PA-C   Consent Given by:  Patient Site marked: the procedure site  was marked   Timeout: prior to procedure the correct patient, procedure, and site was verified   Indications:  Pain Total # of Trigger Points:  2 Location: neck   Needle Size:  27 G Approach:  Dorsal Medications #1:  0.5 mL lidocaine 1 %; 10 mg triamcinolone acetonide 40 MG/ML Medications #2:  0.5 mL lidocaine 1 %; 10 mg triamcinolone acetonide 40 MG/ML Patient tolerance:  Patient tolerated the procedure well with no immediate complications  Allergies: Ramipril      Assessment / Plan:     Visit Diagnoses: Rheumatoid arthritis with rheumatoid factor of multiple sites without organ or systems involvement (HCC): She has no synovitis on examination today.  She has not had any signs or symptoms of a rheumatoid arthritis flare.  She has clinically been doing well taking methotrexate 8 tablets by mouth once weekly and folic acid 2 mg daily.  She continues to tolerate methotrexate without any side effects and has not missed any doses recently.  She continues to experience stiffness in both hands and both knee  joints especially first thing in the morning but has not noticed any inflammation.  She has not been experiencing any nocturnal pain.  She will remain on methotrexate as monotherapy.  She was advised to notify us if she develops signs or symptoms of a flare.  She will follow-up in the office in 5 months or sooner if needed.  High risk medication use - Methotrexate 8 tablets by mouth once weekly and folic acid 2 mg daily.  CBC and CMP updated on 05/29/22.  Orders for CBC and CMP released today.  Her next lab work will be due in February and every 3 months to monitor for drug toxicity. She has not had any recent or recurrent infections.  Discussed the importance of holding methotrexate if she develops signs or symptoms of an infection and to resume once the infection has completely cleared.  She is planning on getting the annual flu shot. - Plan: CBC with Differential/Platelet, COMPLETE METABOLIC PANEL WITH GFR  Pain in both hands - X-rays of both hands were unremarkable on 04/08/2021.  No inflammation noted on examination.   Lateral epicondylitis, right elbow - Resolved.  Primary osteoarthritis of both knees - X-rays of the right knee were consistent with mild osteoarthritis and mild chondromalacia patella.  X-rays of the left knee were unremarkable on 04/08/2021. Good range of motion of both knee joints on examination today.  No warmth or effusion noted.  She has some tenderness over the Pez anserine bursa of the right knee. We will reapply for Visco gel injections for both knee joints.  This patient is diagnosed with osteoarthritis of the knee(s).    Radiographs show evidence of joint space narrowing, osteophytes, subchondral sclerosis and/or subchondral cysts.  This patient has knee pain which interferes with functional and activities of daily living.    This patient has experienced inadequate response, adverse effects and/or intolerance with conservative treatments such as acetaminophen, NSAIDS,  topical creams, physical therapy or regular exercise, knee bracing and/or weight loss.   This patient has experienced inadequate response or has a contraindication to intra articular steroid injections for at least 3 months.   This patient is not scheduled to have a total knee replacement within 6 months of starting treatment with viscosupplementation.  Primary osteoarthritis of both feet: She is not experiencing any increased discomfort in her feet at this time.   DDD (degenerative disc disease), cervical: She presents today with increased pain  in the neck which has been consistently bothering her for the past 1 month.  Updated x-rays were obtained today.  She will be referred to a spine specialist for further evaluation.  Bilateral trapezius trigger point injections were performed today to alleviate her muscle tension and tenderness.  Neck pain - Patient presents today with increased neck pain.  Her symptoms have been consistent on a daily basis for the past 3 months.  No recent injury prior to the onset of symptoms.  She has been experiencing intermittent radiating pain to the left shoulder blade and under her left arm.  On examination she has limited range of motion with lateral rotation to the left.  Good flexion and extension noted.  She has trapezius muscle tension and tenderness bilaterally, left greater than right.  X-rays of the C-spine were obtained today which were consistent with significant narrowing between C5-C6 and C6-C7.  She also has facet joint arthropathy.   Different treatment options were discussed today in detail.  She requested bilateral trapezius trigger point injections today.  She tolerated the procedures well.  Procedure notes were completed above.  Aftercare was discussed.  A referral to a spine specialist will be placed for further evaluation and management.  Plan: XR Cervical Spine 2 or 3 views  Trapezius muscle spasm -Patient presents today with trapezius muscle tension  tenderness bilaterally, left greater than right.  Different treatment options were discussed today in detail.  She requested trigger point injections bilaterally.  She tolerated the procedures well.  Procedure notes were completed above.  Aftercare was discussed.  Plan: Trigger Point Inj  Other medical conditions are listed as follows:  Essential hypertension: Blood pressure was 139/83 today in the office.  Urge incontinence  Prediabetes  Acquired hypothyroidism  History of diverticulosis  Primary insomnia  Family history of rheumatoid arthritis    Orders: Orders Placed This Encounter  Procedures   Trigger Point Inj   XR Cervical Spine 2 or 3 views   CBC with Differential/Platelet   COMPLETE METABOLIC PANEL WITH GFR   No orders of the defined types were placed in this encounter.    Follow-Up Instructions: No follow-ups on file.   Gearldine Bienenstock, PA-C  Note - This record has been created using Dragon software.  Chart creation errors have been sought, but may not always  have been located. Such creation errors do not reflect on  the standard of medical care.

## 2022-09-05 ENCOUNTER — Other Ambulatory Visit: Payer: Self-pay | Admitting: *Deleted

## 2022-09-05 ENCOUNTER — Encounter: Payer: Self-pay | Admitting: Physician Assistant

## 2022-09-05 ENCOUNTER — Telehealth: Payer: Self-pay

## 2022-09-05 ENCOUNTER — Ambulatory Visit: Payer: Managed Care, Other (non HMO) | Attending: Physician Assistant | Admitting: Physician Assistant

## 2022-09-05 ENCOUNTER — Ambulatory Visit (INDEPENDENT_AMBULATORY_CARE_PROVIDER_SITE_OTHER): Payer: Managed Care, Other (non HMO)

## 2022-09-05 VITALS — BP 139/83 | HR 56 | Resp 15 | Ht 61.0 in | Wt 224.8 lb

## 2022-09-05 DIAGNOSIS — M79642 Pain in left hand: Secondary | ICD-10-CM

## 2022-09-05 DIAGNOSIS — M542 Cervicalgia: Secondary | ICD-10-CM | POA: Diagnosis not present

## 2022-09-05 DIAGNOSIS — M62838 Other muscle spasm: Secondary | ICD-10-CM | POA: Diagnosis not present

## 2022-09-05 DIAGNOSIS — Z8261 Family history of arthritis: Secondary | ICD-10-CM

## 2022-09-05 DIAGNOSIS — R7303 Prediabetes: Secondary | ICD-10-CM

## 2022-09-05 DIAGNOSIS — Z79899 Other long term (current) drug therapy: Secondary | ICD-10-CM

## 2022-09-05 DIAGNOSIS — M0579 Rheumatoid arthritis with rheumatoid factor of multiple sites without organ or systems involvement: Secondary | ICD-10-CM

## 2022-09-05 DIAGNOSIS — M7711 Lateral epicondylitis, right elbow: Secondary | ICD-10-CM | POA: Diagnosis not present

## 2022-09-05 DIAGNOSIS — M5030 Other cervical disc degeneration, unspecified cervical region: Secondary | ICD-10-CM

## 2022-09-05 DIAGNOSIS — N3941 Urge incontinence: Secondary | ICD-10-CM

## 2022-09-05 DIAGNOSIS — Z8719 Personal history of other diseases of the digestive system: Secondary | ICD-10-CM

## 2022-09-05 DIAGNOSIS — M79641 Pain in right hand: Secondary | ICD-10-CM | POA: Diagnosis not present

## 2022-09-05 DIAGNOSIS — F5101 Primary insomnia: Secondary | ICD-10-CM

## 2022-09-05 DIAGNOSIS — I10 Essential (primary) hypertension: Secondary | ICD-10-CM

## 2022-09-05 DIAGNOSIS — M19072 Primary osteoarthritis, left ankle and foot: Secondary | ICD-10-CM

## 2022-09-05 DIAGNOSIS — M19071 Primary osteoarthritis, right ankle and foot: Secondary | ICD-10-CM

## 2022-09-05 DIAGNOSIS — E039 Hypothyroidism, unspecified: Secondary | ICD-10-CM

## 2022-09-05 DIAGNOSIS — M170 Bilateral primary osteoarthritis of knee: Secondary | ICD-10-CM

## 2022-09-05 MED ORDER — TRIAMCINOLONE ACETONIDE 40 MG/ML IJ SUSP
10.0000 mg | INTRAMUSCULAR | Status: AC | PRN
  Administered 2022-09-05: 10 mg via INTRAMUSCULAR

## 2022-09-05 MED ORDER — LIDOCAINE HCL 1 % IJ SOLN
0.5000 mL | INTRAMUSCULAR | Status: AC | PRN
  Administered 2022-09-05: .5 mL

## 2022-09-05 NOTE — Telephone Encounter (Signed)
Please apply for bilateral knee visco, per Taylor Dale, PA-C. Thanks!  

## 2022-09-05 NOTE — Addendum Note (Signed)
Addended by: Gearldine Bienenstock on: 09/05/2022 12:36 PM   Modules accepted: Level of Service

## 2022-09-05 NOTE — Progress Notes (Signed)
C-spine x-ray findings are consistent with multilevel spondylosis and facet joint arthropathy.  Please notify the patient.  Recommend a referral to a spine specialist if her symptoms persist or worsen.

## 2022-09-05 NOTE — Progress Notes (Signed)
CBC WNL

## 2022-09-06 LAB — COMPLETE METABOLIC PANEL WITH GFR
AG Ratio: 1.6 (calc) (ref 1.0–2.5)
ALT: 23 U/L (ref 6–29)
AST: 26 U/L (ref 10–35)
Albumin: 4 g/dL (ref 3.6–5.1)
Alkaline phosphatase (APISO): 93 U/L (ref 37–153)
BUN: 8 mg/dL (ref 7–25)
CO2: 22 mmol/L (ref 20–32)
Calcium: 8.6 mg/dL (ref 8.6–10.4)
Chloride: 106 mmol/L (ref 98–110)
Creat: 0.59 mg/dL (ref 0.50–1.03)
Globulin: 2.5 g/dL (calc) (ref 1.9–3.7)
Glucose, Bld: 118 mg/dL — ABNORMAL HIGH (ref 65–99)
Potassium: 4.2 mmol/L (ref 3.5–5.3)
Sodium: 138 mmol/L (ref 135–146)
Total Bilirubin: 0.3 mg/dL (ref 0.2–1.2)
Total Protein: 6.5 g/dL (ref 6.1–8.1)
eGFR: 106 mL/min/{1.73_m2} (ref >=60)

## 2022-09-06 LAB — CBC WITH DIFFERENTIAL/PLATELET
Absolute Monocytes: 828 cells/uL (ref 200–950)
Basophils Absolute: 71 cells/uL (ref 0–200)
Basophils Relative: 0.7 %
Eosinophils Absolute: 273 cells/uL (ref 15–500)
Eosinophils Relative: 2.7 %
HCT: 41.4 % (ref 35.0–45.0)
Hemoglobin: 14.1 g/dL (ref 11.7–15.5)
Lymphs Abs: 2323 cells/uL (ref 850–3900)
MCH: 30.4 pg (ref 27.0–33.0)
MCHC: 34.1 g/dL (ref 32.0–36.0)
MCV: 89.2 fL (ref 80.0–100.0)
MPV: 11.7 fL (ref 7.5–12.5)
Monocytes Relative: 8.2 %
Neutro Abs: 6605 cells/uL (ref 1500–7800)
Neutrophils Relative %: 65.4 %
Platelets: 247 10*3/uL (ref 140–400)
RBC: 4.64 10*6/uL (ref 3.80–5.10)
RDW: 13.1 % (ref 11.0–15.0)
Total Lymphocyte: 23 %
WBC: 10.1 10*3/uL (ref 3.8–10.8)

## 2022-09-06 NOTE — Progress Notes (Signed)
Glucose is 118. Rest of CMP WNL

## 2022-09-10 ENCOUNTER — Other Ambulatory Visit: Payer: Self-pay | Admitting: Physician Assistant

## 2022-09-11 NOTE — Telephone Encounter (Signed)
Next Visit: 12/07/2022  Last Visit: 09/05/2022  Last Fill: 06/23/2022  DX:  Rheumatoid arthritis with rheumatoid factor of multiple sites without organ or systems involvement   Current Dose per office note 09/05/2022: Methotrexate 8 tablets by mouth once weekly   Labs: 09/05/2022 Glucose is 118. Rest of CMP WNL   Okay to refill MTX?

## 2022-10-10 ENCOUNTER — Encounter: Payer: Self-pay | Admitting: Physician Assistant

## 2022-10-10 ENCOUNTER — Ambulatory Visit: Payer: Managed Care, Other (non HMO) | Attending: Physician Assistant | Admitting: Physician Assistant

## 2022-10-10 VITALS — BP 145/79 | HR 64 | Resp 18 | Ht 62.0 in | Wt 219.0 lb

## 2022-10-10 DIAGNOSIS — Z79899 Other long term (current) drug therapy: Secondary | ICD-10-CM

## 2022-10-10 DIAGNOSIS — M0579 Rheumatoid arthritis with rheumatoid factor of multiple sites without organ or systems involvement: Secondary | ICD-10-CM | POA: Diagnosis not present

## 2022-10-10 DIAGNOSIS — M19071 Primary osteoarthritis, right ankle and foot: Secondary | ICD-10-CM

## 2022-10-10 DIAGNOSIS — I10 Essential (primary) hypertension: Secondary | ICD-10-CM

## 2022-10-10 DIAGNOSIS — M7711 Lateral epicondylitis, right elbow: Secondary | ICD-10-CM

## 2022-10-10 DIAGNOSIS — R7303 Prediabetes: Secondary | ICD-10-CM

## 2022-10-10 DIAGNOSIS — M19072 Primary osteoarthritis, left ankle and foot: Secondary | ICD-10-CM

## 2022-10-10 DIAGNOSIS — M170 Bilateral primary osteoarthritis of knee: Secondary | ICD-10-CM

## 2022-10-10 DIAGNOSIS — Z8261 Family history of arthritis: Secondary | ICD-10-CM

## 2022-10-10 DIAGNOSIS — Z8719 Personal history of other diseases of the digestive system: Secondary | ICD-10-CM

## 2022-10-10 DIAGNOSIS — E039 Hypothyroidism, unspecified: Secondary | ICD-10-CM

## 2022-10-10 DIAGNOSIS — N3941 Urge incontinence: Secondary | ICD-10-CM

## 2022-10-10 DIAGNOSIS — M5030 Other cervical disc degeneration, unspecified cervical region: Secondary | ICD-10-CM

## 2022-10-10 DIAGNOSIS — F5101 Primary insomnia: Secondary | ICD-10-CM

## 2022-10-10 MED ORDER — PREDNISONE 5 MG PO TABS: ORAL_TABLET | ORAL | 0 refills | Status: DC

## 2022-10-10 NOTE — Progress Notes (Signed)
Office Visit Note  Patient: Laura Barrett             Date of Birth: Oct 17, 1965           MRN: [id number]             PCP: Krystal Clark, NP Referring: Rhea Bleacher* Visit Date: 10/10/2022 Occupation: @GUAROCC @  Subjective:  Pain in multiple joints   History of Present Illness: Laura Barrett is a 57 y.o. female with history of seropositive rheumatoid arthritis.  Patient remains on methotrexate 8 tablets by mouth once weekly and folic acid 2 mg daily.  She continues to tolerate methotrexate without any side effects and has not missed any doses recently.  Patient reports for the past 2 weeks she has been experiencing a flare involving multiple joints.  Her pain has been most severe and most consistent in both hands and both wrist joints.  She has noticed swelling and has had to ice during the day for relief.  She is also noticing increased pain and inflammation in her toes as well as twinges of pain in both elbows and both ankle joints.  She attributes her recent flare to baking for the holidays. She continues to experience persistent pain in both knee joints.  She is awaiting approval for Visco gel injections for both knees which have alleviated her symptoms in the past. She denies any recent or recurrent infections.    Activities of Daily Living:  Patient reports morning stiffness for 2-3 hours.   Patient Reports nocturnal pain.  Difficulty dressing/grooming: Denies Difficulty climbing stairs: Reports Difficulty getting out of chair: Reports Difficulty using hands for taps, buttons, cutlery, and/or writing: Reports  Review of Systems  Constitutional:  Positive for fatigue.  HENT:  Positive for mouth dryness. Negative for mouth sores and nose dryness.   Eyes:  Negative for pain, visual disturbance and dryness.  Respiratory:  Negative for cough, hemoptysis, shortness of breath and difficulty breathing.   Cardiovascular:  Positive for swelling in  legs/feet. Negative for chest pain, palpitations and hypertension.  Gastrointestinal:  Positive for diarrhea. Negative for blood in stool and constipation.  Endocrine: Negative for increased urination.  Genitourinary:  Negative for painful urination.  Musculoskeletal:  Positive for joint pain, joint pain, joint swelling, muscle weakness, morning stiffness and muscle tenderness. Negative for gait problem, myalgias and myalgias.  Skin:  Negative for color change, pallor, rash, hair loss, nodules/bumps, skin tightness, ulcers and sensitivity to sunlight.  Allergic/Immunologic: Negative for susceptible to infections.  Neurological:  Negative for dizziness, numbness, headaches and weakness.  Hematological:  Negative for swollen glands.  Psychiatric/Behavioral:  Positive for depressed mood. Negative for sleep disturbance. The patient is nervous/anxious.     PMFS History:  Patient Active Problem List   Diagnosis Date Noted   Urge incontinence 04/08/2021   Primary insomnia 04/08/2021   Prediabetes 04/08/2021   Acquired hypothyroidism 04/08/2021   History of diverticulosis 04/08/2021   Essential hypertension 04/08/2021   Primary osteoarthritis of both knees 04/08/2021   High risk medication use 04/08/2021    Past Medical History:  Diagnosis Date   Osteoarthritis    Rheumatoid arthritis (HCC)     Family History  Problem Relation Age of Onset   Hypertension Mother    Stroke Mother    Hypertension Father    Rheum arthritis Sister    Past Surgical History:  Procedure Laterality Date   BOWEL RESECTION  08/2012   CHOLECYSTECTOMY  01/2005   ILEOSTOMY     &  reversal   Social History   Social History Narrative   Not on file   Immunization History  Administered Date(s) Administered   PFIZER(Purple Top)SARS-COV-2 Vaccination 12/29/2019, 01/26/2020, 06/25/2020, 02/04/2021     Objective: Vital Signs: BP (!) 145/79 (BP Location: Left Arm, Patient Position: Sitting, Cuff Size: Large)    Pulse 64   Resp 18   Ht 5\' 2"  (1.575 m)   Wt 219 lb (99.3 kg)   BMI 40.06 kg/m    Physical Exam Vitals and nursing note reviewed.  Constitutional:      Appearance: She is well-developed.  HENT:     Head: Normocephalic and atraumatic.  Eyes:     Conjunctiva/sclera: Conjunctivae normal.  Cardiovascular:     Rate and Rhythm: Normal rate and regular rhythm.     Heart sounds: Normal heart sounds.  Pulmonary:     Effort: Pulmonary effort is normal.     Breath sounds: Normal breath sounds.  Abdominal:     General: Bowel sounds are normal.     Palpations: Abdomen is soft.  Musculoskeletal:     Cervical back: Normal range of motion.  Skin:    General: Skin is warm and dry.     Capillary Refill: Capillary refill takes less than 2 seconds.  Neurological:     Mental Status: She is alert and oriented to person, place, and time.  Psychiatric:        Behavior: Behavior normal.      Musculoskeletal Exam: C-spine has limited range of motion with lateral rotation.  Shoulder joints, elbow joints, wrist joints have good range of motion.  Tenderness over the radial aspect of the right wrist. Tenderness and inflammation in several MCP and PIP joints as described above.  Hip joints have good range of motion with no groin pain.  Knee joints have good range of motion with some discomfort bilaterally.  No warmth or effusion of knee joints noted.  Ankle joints have good range of motion with no tenderness or synovitis. No synovitis over MTP joints.   CDAI Exam: CDAI Score: 18.2  Patient Global: 6 mm; Provider Global: 6 mm Swollen: 8 ; Tender: 9  Joint Exam 10/10/2022      Right  Left  Wrist   Tender     MCP 2  Swollen Tender  Swollen Tender  MCP 3  Swollen Tender  Swollen Tender  MCP 4  Swollen Tender     PIP 2  Swollen Tender  Swollen Tender  PIP 3     Swollen Tender     Investigation: No additional findings.  Imaging: No results found.  Recent Labs: Lab Results  Component Value  Date   WBC 10.1 09/05/2022   HGB 14.1 09/05/2022   PLT 247 09/05/2022   NA 138 09/05/2022   K 4.2 09/05/2022   CL 106 09/05/2022   CO2 22 09/05/2022   GLUCOSE 118 (H) 09/05/2022   BUN 8 09/05/2022   CREATININE 0.59 09/05/2022   BILITOT 0.3 09/05/2022   AST 26 09/05/2022   ALT 23 09/05/2022   PROT 6.5 09/05/2022   CALCIUM 8.6 09/05/2022   QFTBGOLDPLUS NEGATIVE 04/08/2021    Speciality Comments: Humira x 37months-discontinued 03/22 due to inadequate response.  Schedule appointment every 3 months for compliance  Procedures:  No procedures performed Allergies: Ramipril   Assessment / Plan:     Visit Diagnoses: Rheumatoid arthritis with rheumatoid factor of multiple sites without organ or systems involvement [facility]): Patient presents today experiencing a flare involving  multiple joints.  She has been having intermittent pain and swelling in both hands, both wrists, and in her feet intermittently x2 weeks.  She attributes her recent flare to overuse and repetitive activities baking for the holidays.  On examination today she has tenderness and synovitis of several MCP and PIP joints.  She remains on methotrexate 8 tablets by mouth once weekly and folic acid 2 mg daily.  She has not missed any doses of methotrexate recently.  A prednisone taper starting at 20 mg tapering by 5 mg every 4 days was sent to the pharmacy.  Instructions were provided.  She was advised to avoid the use of NSAIDs with prednisone.  She will notify us if her symptoms persist or worsen.  She will remain on methotrexate and folic acid as prescribed.  She will follow-up in the office in February 2024 as scheduled.  High risk medication use - Methotrexate 8 tablets by mouth once weekly and folic acid 2 mg daily. CBC and CMP updated on 09/05/22. Her next lab work will be due in February and every 3 months.  Standing orders for CBC and CMP remain in place. She has not had any recent or recurrent infections.  Discussed the  importance of holding methotrexate if she develops signs or symptoms of an infection and to resume once the infection has completely cleared.  Lateral epicondylitis, right elbow: Resolved.   Primary osteoarthritis of both knees: She has discomfort with range of motion of both knee joints.  No warmth or effusion noted.  She is currently awaiting approval for Visco gel injections for both knee joints which have provided relief in the past.  Primary osteoarthritis of both feet: She has been experiencing increased pain and inflammation in both feet over the past 2 weeks.  On examination today she had no tenderness or synovitis over MTP joints.  Good range of motion of both ankle joints with no tenderness or synovitis noted.  DDD (degenerative disc disease), cervical - X-rays of the C-spine were obtained today which were consistent with significant narrowing between C5-C6 and C6-C7.  Limited range of motion of C-spine especially with lateral rotation.  She has an upcoming appointment with neurosurgery on 10/16/2022.  Other medical conditions are listed as follows:  Essential hypertension: Blood pressure was elevated 145/79 today in the office.  Her blood pressure was rechecked prior to leaving.  She was encouraged to monitor her blood pressure closely.  Urge incontinence  Prediabetes  Acquired hypothyroidism  History of diverticulosis  Primary insomnia  Family history of rheumatoid arthritis  Orders: No orders of the defined types were placed in this encounter.  Meds ordered this encounter  Medications   predniSONE (DELTASONE) 5 MG tablet    Sig: Take 4 tablets by mouth daily x4 days, 3 tablets daily x4 days, 2 tablets daily x4 days, 1 tablet daily x4 days.    Dispense:  40 tablet    Refill:  0      Follow-Up Instructions: Return for Rheumatoid arthritis.   Ofilia Neas, PA-C  Note - This record has been created using Dragon software.  Chart creation errors have been sought,  but may not always  have been located. Such creation errors do not reflect on  the standard of medical care.

## 2022-10-10 NOTE — Telephone Encounter (Signed)
PA required and submitted through the [employer] portal to complete on the office's behalf.   PA pending.  Will follow up once response is received.

## 2022-10-10 NOTE — Telephone Encounter (Signed)
VOB submitted for [employer], Bilateral knee(s) BV pending 

## 2022-10-10 NOTE — Patient Instructions (Signed)
Standing Labs We placed an order today for your standing lab work.   Please have your standing labs drawn in February and every 3 months   Please have your labs drawn 2 weeks prior to your appointment so that the provider can discuss your lab results at your appointment.  Please note that you may see your imaging and lab results in MyChart before we have reviewed them. We will contact you once all results are reviewed. Please allow our office up to 72 hours to thoroughly review all of the results before contacting the office for clarification of your results.  Lab hours are:   Monday through Thursday from 8:00 am -12:30 pm and 1:00 pm-5:00 pm and Friday from 8:00 am-12:00 pm.  Please be advised, all patients with office appointments requiring lab work will take precedent over walk-in lab work.   Labs are drawn by [facility]. Please bring your co-pay at the time of your lab draw.  You may receive a bill from [facility] for your lab work.  Please note if you are on Hydroxychloroquine and and an order has been placed for a Hydroxychloroquine level, you will need to have it drawn 4 hours or more after your last dose.  If you wish to have your labs drawn at another location, please call the office 24 hours in advance so we can fax the orders.  The office is located at [address], [address], [place], [place] [address] No appointment is necessary.    If you have any questions regarding directions or hours of operation,  please call [phone].   As a reminder, please drink plenty of water prior to coming for your lab work. Thanks!  

## 2022-11-08 DIAGNOSIS — M5030 Other cervical disc degeneration, unspecified cervical region: Secondary | ICD-10-CM | POA: Insufficient documentation

## 2022-11-21 NOTE — Telephone Encounter (Signed)
Please call to schedule visco injections.  Approved for Euflexxa, Bilateral knee(s). Buy & Bill Covered at 100%  $45 co-pay Deductible does not apply Auth# [id number] Valid: 10/11/22 to 04/11/23

## 2022-11-23 NOTE — Progress Notes (Deleted)
Office Visit Note  Patient: Laura Barrett             Date of Birth: 26-May-1966           MRN: [id number]             PCP: Mayer Camel, NP Referring: Bess Harvest* Visit Date: 12/07/2022 Occupation: @GUAROCC$ @  Subjective:  No chief complaint on file.   History of Present Illness: Laura Barrett is a 57 y.o. female ***     Activities of Daily Living:  Patient reports morning stiffness for *** {minute/hour:19697}.   Patient {ACTIONS;DENIES/REPORTS:21021675::"Denies"} nocturnal pain.  Difficulty dressing/grooming: {ACTIONS;DENIES/REPORTS:21021675::"Denies"} Difficulty climbing stairs: {ACTIONS;DENIES/REPORTS:21021675::"Denies"} Difficulty getting out of chair: {ACTIONS;DENIES/REPORTS:21021675::"Denies"} Difficulty using hands for taps, buttons, cutlery, and/or writing: {ACTIONS;DENIES/REPORTS:21021675::"Denies"}  No Rheumatology ROS completed.   PMFS History:  Patient Active Problem List   Diagnosis Date Noted   Urge incontinence 04/08/2021   Primary insomnia 04/08/2021   Prediabetes 04/08/2021   Acquired hypothyroidism 04/08/2021   History of diverticulosis 04/08/2021   Essential hypertension 04/08/2021   Primary osteoarthritis of both knees 04/08/2021   High risk medication use 04/08/2021    Past Medical History:  Diagnosis Date   Osteoarthritis    Rheumatoid arthritis ([place])     Family History  Problem Relation Age of Onset   Hypertension Mother    Stroke Mother    Hypertension Father    Rheum arthritis Sister    Past Surgical History:  Procedure Laterality Date   BOWEL RESECTION  08/2012   CHOLECYSTECTOMY  01/2005   ILEOSTOMY     & reversal   Social History   Social History Narrative   Not on file   Immunization History  Administered Date(s) Administered   PFIZER(Purple Top)SARS-COV-2 Vaccination 12/29/2019, 01/26/2020, 06/25/2020, 02/04/2021     Objective: Vital Signs: There were no vitals taken for this visit.    Physical Exam   Musculoskeletal Exam: ***  CDAI Exam: CDAI Score: -- Patient Global: --; Provider Global: -- Swollen: --; Tender: -- Joint Exam 12/07/2022   No joint exam has been documented for this visit   There is currently no information documented on the homunculus. Go to the Rheumatology activity and complete the homunculus joint exam.  Investigation: No additional findings.  Imaging: No results found.  Recent Labs: Lab Results  Component Value Date   WBC 10.1 09/05/2022   HGB 14.1 09/05/2022   PLT 247 09/05/2022   NA 138 09/05/2022   K 4.2 09/05/2022   CL 106 09/05/2022   CO2 22 09/05/2022   GLUCOSE 118 (H) 09/05/2022   BUN 8 09/05/2022   CREATININE 0.59 09/05/2022   BILITOT 0.3 09/05/2022   AST 26 09/05/2022   ALT 23 09/05/2022   PROT 6.5 09/05/2022   CALCIUM 8.6 09/05/2022   QFTBGOLDPLUS NEGATIVE 04/08/2021    Speciality Comments: Humira x 90month-discontinued 03/22 due to inadequate response.  Schedule appointment every 3 months for compliance  Procedures:  No procedures performed Allergies: Ramipril   Assessment / Plan:     Visit Diagnoses: Rheumatoid arthritis with rheumatoid factor of multiple sites without organ or systems involvement (HCC)  High risk medication use  Lateral epicondylitis, right elbow  Primary osteoarthritis of both knees  Primary osteoarthritis of both feet  DDD (degenerative disc disease), cervical  Essential hypertension  Urge incontinence  Prediabetes  Acquired hypothyroidism  History of diverticulosis  Primary insomnia  Family history of rheumatoid arthritis  Orders: No orders of the defined types were placed  in this encounter.  No orders of the defined types were placed in this encounter.   Face-to-face time spent with patient was *** minutes. Greater than 50% of time was spent in counseling and coordination of care.  Follow-Up Instructions: No follow-ups on file.   Ofilia Neas,  PA-C  Note - This record has been created using Dragon software.  Chart creation errors have been sought, but may not always  have been located. Such creation errors do not reflect on  the standard of medical care.

## 2022-12-05 NOTE — Progress Notes (Unsigned)
Office Visit Note  Patient: Laura Barrett             Date of Birth: 1965-11-02           MRN: [id number]             PCP: Mayer Camel, NP Referring: Bess Harvest* Visit Date: 12/07/2022 Occupation: '@GUAROCC'$ @  Subjective:  Medication monitoring   History of Present Illness: Laura Barrett is a 57 y.o. female with history of rheumatoid arthritis and osteoarthritis.  Patient is currently taking methotrexate 8 tablets by mouth once weekly and folic acid 2 mg daily.  She has been tolerating methotrexate along with folic acid without any side effects.  Patient reports that she held her dose of methotrexate on Sunday due to concern for an infection.  She was evaluated at urgent care and her symptoms were felt to be due to seasonal allergies.  She started to take Xyzal on a daily basis.  Patient reports that her symptoms have been very intermittent but she has not noticed any new or worsening symptoms.  She denies missing any other doses of methotrexate recently. Patient was given a prescription for prednisone at her last office visit on 10/10/2022.  Patient reports that the pain and swelling she was experiencing resolved with the prednisone taper.  She states she continues to have some stiffness in her right wrist but denies any discomfort or tenderness.  She presents today for her first Euflexxa injection in both knee joints.  She states that she has noticed about a 70% improvement in her neck pain and stiffness since starting dry needling at physical therapy.  She states that the physical therapist will now start helping to try to alleviate her right SI joint pain.  She has tried manipulation and dry needling thus far.    Activities of Daily Living:  Patient reports morning stiffness for 2 hours.   Patient Reports nocturnal pain.  Difficulty dressing/grooming: Denies Difficulty climbing stairs: Reports Difficulty getting out of chair: Reports Difficulty using hands  for taps, buttons, cutlery, and/or writing: Reports  Review of Systems  Constitutional:  Positive for fatigue.  HENT:  Positive for mouth dryness. Negative for mouth sores.   Eyes: Negative.  Negative for dryness.  Respiratory: Negative.  Negative for shortness of breath.   Cardiovascular: Negative.  Negative for chest pain and palpitations.  Gastrointestinal: Negative.  Negative for blood in stool, constipation and diarrhea.  Endocrine: Negative.  Negative for increased urination.  Genitourinary:  Positive for involuntary urination.  Musculoskeletal:  Positive for joint pain, joint pain, joint swelling and morning stiffness. Negative for gait problem, myalgias, muscle weakness, muscle tenderness and myalgias.  Skin:  Positive for sensitivity to sunlight. Negative for color change, rash and hair loss.  Allergic/Immunologic: Negative.  Negative for susceptible to infections.  Neurological:  Positive for dizziness and headaches.  Hematological: Negative.  Negative for swollen glands.  Psychiatric/Behavioral:  Positive for depressed mood and sleep disturbance. The patient is nervous/anxious.     PMFS History:  Patient Active Problem List   Diagnosis Date Noted   Urge incontinence 04/08/2021   Primary insomnia 04/08/2021   Prediabetes 04/08/2021   Acquired hypothyroidism 04/08/2021   History of diverticulosis 04/08/2021   Essential hypertension 04/08/2021   Primary osteoarthritis of both knees 04/08/2021   High risk medication use 04/08/2021    Past Medical History:  Diagnosis Date   Osteoarthritis    Rheumatoid arthritis ([place])     Family History  Problem Relation Age of Onset   Hypertension Mother    Stroke Mother    Hypertension Father    Rheum arthritis Sister    Past Surgical History:  Procedure Laterality Date   BOWEL RESECTION  08/2012   CHOLECYSTECTOMY  01/2005   ILEOSTOMY     & reversal   Social History   Social History Narrative   Not on file   Immunization  History  Administered Date(s) Administered   PFIZER(Purple Top)SARS-COV-2 Vaccination 12/29/2019, 01/26/2020, 06/25/2020, 02/04/2021     Objective: Vital Signs: BP (!) 146/83 (BP Location: Left Arm, Patient Position: Sitting, Cuff Size: Large)   Pulse 71   Ht 5' 1.5" (1.562 m)   Wt 220 lb (99.8 kg) Comment: pt reported  BMI 40.90 kg/m    Physical Exam Vitals and nursing note reviewed.  Constitutional:      Appearance: She is well-developed.  HENT:     Head: Normocephalic and atraumatic.  Eyes:     Conjunctiva/sclera: Conjunctivae normal.  Cardiovascular:     Rate and Rhythm: Normal rate and regular rhythm.     Heart sounds: Normal heart sounds.  Pulmonary:     Effort: Pulmonary effort is normal.     Breath sounds: Normal breath sounds.  Abdominal:     General: Bowel sounds are normal.     Palpations: Abdomen is soft.  Musculoskeletal:     Cervical back: Normal range of motion.  Skin:    General: Skin is warm and dry.     Capillary Refill: Capillary refill takes less than 2 seconds.  Neurological:     Mental Status: She is alert and oriented to person, place, and time.  Psychiatric:        Behavior: Behavior normal.      Musculoskeletal Exam: C-spine has slightly limited range of motion with lateral rotation-improved.  Tenderness over the left SI joint.  Shoulder joints, elbow joints, wrist joints, MCPs, PIPs, DIPs have good range of motion with no synovitis.  Some stiffness with range of motion in the right wrist but no tenderness or synovitis noted.  Mild tenderness over the right second MCP joint but no active inflammation.  Complete fist formation noted.  Hip joints have good range of motion.  Knee joints have good range of motion with no warmth or effusion.  Ankle joints have good range of motion with no tenderness or joint swelling.  CDAI Exam: CDAI Score: -- Patient Global: 2 mm; Provider Global: 2 mm Swollen: --; Tender: -- Joint Exam 12/07/2022   No joint  exam has been documented for this visit   There is currently no information documented on the homunculus. Go to the Rheumatology activity and complete the homunculus joint exam.  Investigation: No additional findings.  Imaging: No results found.  Recent Labs: Lab Results  Component Value Date   WBC 10.1 09/05/2022   HGB 14.1 09/05/2022   PLT 247 09/05/2022   NA 138 09/05/2022   K 4.2 09/05/2022   CL 106 09/05/2022   CO2 22 09/05/2022   GLUCOSE 118 (H) 09/05/2022   BUN 8 09/05/2022   CREATININE 0.59 09/05/2022   BILITOT 0.3 09/05/2022   AST 26 09/05/2022   ALT 23 09/05/2022   PROT 6.5 09/05/2022   CALCIUM 8.6 09/05/2022   QFTBGOLDPLUS NEGATIVE 04/08/2021    Speciality Comments: Humira x 110month-discontinued 03/22 due to inadequate response.  Schedule appointment every 3 months for compliance  Procedures:  Large Joint Inj: bilateral knee on 12/07/2022 8:02 AM  Indications: pain Details: 27 G 1.5 in needle, medial approach  Arthrogram: No  Medications (Right): 1.5 mL lidocaine 1 %; 20 mg Sodium Hyaluronate (Viscosup) 20 MG/2ML Aspirate (Right): 0 mL Medications (Left): 1.5 mL lidocaine 1 %; 20 mg Sodium Hyaluronate (Viscosup) 20 MG/2ML Aspirate (Left): 0 mL Outcome: tolerated well, no immediate complications Procedure, treatment alternatives, risks and benefits explained, specific risks discussed. Consent was given by the patient. Immediately prior to procedure a time out was called to verify the correct patient, procedure, equipment, support staff and site/side marked as required. Patient was prepped and draped in the usual sterile fashion.     Allergies: Ramipril   Assessment / Plan:     Visit Diagnoses: Rheumatoid arthritis with rheumatoid factor of multiple sites without organ or systems involvement [facility]): She has no synovitis on examination today.  She was last seen in the office on 10/10/2022 at which time she was experiencing a flare involving both hands.  She  was given a prednisone taper at that time which alleviated her symptoms.  She continues to have some stiffness in the right wrist but has no tenderness or synovitis today.  She remains on methotrexate 8 tablets by mouth once weekly along with folic acid 2 mg daily.  She continues to tolerate methotrexate without any side effects.  She has been experiencing some increased discomfort in both knee joints and presents today for the first Euflexxa injection of the series.  She tolerated the procedures well.  She will  remain on methotrexate and folic acid as prescribed.  She was advised to notify us if she develops signs or symptoms of a flare. She will follow up in 5 months.  High risk medication use - Methotrexate 8 tablets by mouth once weekly and folic acid 2 mg daily. CBC and CMP updated on 11/08/22.  Her next lab work will be due in April and every 3 months. Standing orders for CBC and CMP remain in place.  She had her dose of methotrexate on Sunday prior to being evaluated at urgent care.  According to the patient she was told that her symptoms were secondary to seasonal allergies.  She has started to take Xyzal.  She has not noticed any new or worsening symptoms since then.  Her symptoms of congestion and wheezing have been intermittent.  Discussed that she could try taking Benadryl at night discussed the importance of holding methotrexate if she develops signs or symptoms of an infection and to resume once the infection has completely cleared.   Lateral epicondylitis, right elbow: Resolved.   Primary osteoarthritis of both knees - She presents today for the first Euflexxa injection of the series for both knees.  She tolerated the procedures well.  Aftercare was discussed.  She has the other 2 appointments scheduled for the next 2 weeks.  Plan: Large Joint Inj: bilateral knee  Primary osteoarthritis of both feet: She is not experiencing any increased discomfort in her feet at this time.  She is wearing  proper fitting shoes.  She has no tenderness or synovitis of the ankle joints on examination today.  DDD (degenerative disc disease), cervical - X-rays of the C-spine were obtained today which were consistent with significant narrowing between C5-C6 and C6-C7.  Patient has been going to physical therapy which has been helpful.  She has noticed about a 70% improvement in her pain and stiffness since undergoing dry needling.  Essential hypertension: Blood pressure was 146/83 today in the office.  Advised patient to  monitor blood pressure closely.  Other medical conditions are listed as follows:  Urge incontinence  Prediabetes  Acquired hypothyroidism  History of diverticulosis  Primary insomnia  Family history of rheumatoid arthritis  Orders: Orders Placed This Encounter  Procedures   Large Joint Inj: bilateral knee   No orders of the defined types were placed in this encounter.     Follow-Up Instructions: Return in about 5 months (around 05/07/2023) for Rheumatoid arthritis.   Ofilia Neas, PA-C  Note - This record has been created using Dragon software.  Chart creation errors have been sought, but may not always  have been located. Such creation errors do not reflect on  the standard of medical care.

## 2022-12-07 ENCOUNTER — Ambulatory Visit: Payer: Managed Care, Other (non HMO) | Admitting: Physician Assistant

## 2022-12-07 ENCOUNTER — Encounter: Payer: Self-pay | Admitting: Physician Assistant

## 2022-12-07 ENCOUNTER — Ambulatory Visit: Payer: Managed Care, Other (non HMO) | Attending: Physician Assistant | Admitting: Physician Assistant

## 2022-12-07 VITALS — BP 146/83 | HR 71 | Ht 61.5 in | Wt 220.0 lb

## 2022-12-07 DIAGNOSIS — M0579 Rheumatoid arthritis with rheumatoid factor of multiple sites without organ or systems involvement: Secondary | ICD-10-CM | POA: Diagnosis not present

## 2022-12-07 DIAGNOSIS — M7711 Lateral epicondylitis, right elbow: Secondary | ICD-10-CM

## 2022-12-07 DIAGNOSIS — M19072 Primary osteoarthritis, left ankle and foot: Secondary | ICD-10-CM

## 2022-12-07 DIAGNOSIS — Z79899 Other long term (current) drug therapy: Secondary | ICD-10-CM

## 2022-12-07 DIAGNOSIS — Z8261 Family history of arthritis: Secondary | ICD-10-CM

## 2022-12-07 DIAGNOSIS — E039 Hypothyroidism, unspecified: Secondary | ICD-10-CM

## 2022-12-07 DIAGNOSIS — Z8719 Personal history of other diseases of the digestive system: Secondary | ICD-10-CM

## 2022-12-07 DIAGNOSIS — I10 Essential (primary) hypertension: Secondary | ICD-10-CM

## 2022-12-07 DIAGNOSIS — M170 Bilateral primary osteoarthritis of knee: Secondary | ICD-10-CM | POA: Diagnosis not present

## 2022-12-07 DIAGNOSIS — F5101 Primary insomnia: Secondary | ICD-10-CM

## 2022-12-07 DIAGNOSIS — R7303 Prediabetes: Secondary | ICD-10-CM

## 2022-12-07 DIAGNOSIS — M19071 Primary osteoarthritis, right ankle and foot: Secondary | ICD-10-CM

## 2022-12-07 DIAGNOSIS — N3941 Urge incontinence: Secondary | ICD-10-CM

## 2022-12-07 DIAGNOSIS — M5030 Other cervical disc degeneration, unspecified cervical region: Secondary | ICD-10-CM

## 2022-12-07 MED ORDER — SODIUM HYALURONATE (VISCOSUP) 20 MG/2ML IX SOSY
20.0000 mg | PREFILLED_SYRINGE | INTRA_ARTICULAR | Status: AC | PRN
  Administered 2022-12-07: 20 mg via INTRA_ARTICULAR

## 2022-12-07 MED ORDER — LIDOCAINE HCL 1 % IJ SOLN
1.5000 mL | INTRAMUSCULAR | Status: AC | PRN
  Administered 2022-12-07: 1.5 mL

## 2022-12-07 NOTE — Patient Instructions (Signed)
Standing Labs We placed an order today for your standing lab work.   Please have your standing labs drawn in April and every 3 months   Please have your labs drawn 2 weeks prior to your appointment so that the provider can discuss your lab results at your appointment, if possible.  Please note that you may see your imaging and lab results in [place] before we have reviewed them. We will contact you once all results are reviewed. Please allow our office up to 72 hours to thoroughly review all of the results before contacting the office for clarification of your results.  WALK-IN LAB HOURS  Monday through Thursday from 8:00 am -12:30 pm and 1:00 pm-5:00 pm and Friday from 8:00 am-12:00 pm.  Patients with office visits requiring labs will be seen before walk-in labs.  You may encounter longer than normal wait times. Please allow additional time. Wait times may be shorter on  Monday and Thursday afternoons.  We do not book appointments for walk-in labs. We appreciate your patience and understanding with our staff.   Labs are drawn by [facility]. Please bring your co-pay at the time of your lab draw.  You may receive a bill from [place] for your lab work.  Please note if you are on Hydroxychloroquine and and an order has been placed for a Hydroxychloroquine level,  you will need to have it drawn 4 hours or more after your last dose.  If you wish to have your labs drawn at another location, please call the office 24 hours in advance so we can fax the orders.  The office is located at [address] [place], [place], [place] [address]   If you have any questions regarding directions or hours of operation,  please call [phone].   As a reminder, please drink plenty of water prior to coming for your lab work. Thanks!  If you have signs or symptoms of an infection or start antibiotics: First, call your PCP for workup of your infection. Hold your medication through the infection, until you  complete your antibiotics, and until symptoms resolve if you take the following: Injectable medication (Actemra, Benlysta, Cimzia, Cosentyx, Enbrel, Humira, Kevzara, Orencia, Remicade, Simponi, Stelara, Taltz, Tremfya) Methotrexate Leflunomide (Arava) Mycophenolate (Cellcept) Morrie Sheldon, Olumiant, or Rinvoq Vaccines You are taking a medication(s) that can suppress your immune system.  The following immunizations are recommended: Flu annually Covid-19  Td/Tdap (tetanus, diphtheria, pertussis) every 10 years Pneumonia (Prevnar 15 then Pneumovax 23 at least 1 year apart.  Alternatively, can take Prevnar 20 without needing additional dose) Shingrix: 2 doses from 4 weeks to 6 months apart  Please check with your PCP to make sure you are up to date.

## 2022-12-13 ENCOUNTER — Other Ambulatory Visit: Payer: Self-pay | Admitting: Rheumatology

## 2022-12-14 ENCOUNTER — Ambulatory Visit: Payer: Managed Care, Other (non HMO) | Attending: Physician Assistant | Admitting: Physician Assistant

## 2022-12-14 DIAGNOSIS — M170 Bilateral primary osteoarthritis of knee: Secondary | ICD-10-CM

## 2022-12-14 MED ORDER — SODIUM HYALURONATE (VISCOSUP) 20 MG/2ML IX SOSY
20.0000 mg | PREFILLED_SYRINGE | INTRA_ARTICULAR | Status: AC | PRN
  Administered 2022-12-14: 20 mg via INTRA_ARTICULAR

## 2022-12-14 MED ORDER — LIDOCAINE HCL 1 % IJ SOLN
1.5000 mL | INTRAMUSCULAR | Status: AC | PRN
  Administered 2022-12-14: 1.5 mL

## 2022-12-14 NOTE — Progress Notes (Signed)
   Procedure Note  Patient: Laura Barrett             Date of Birth: 05-Mar-1966           MRN: [id number]             Visit Date: 12/14/2022  Procedures: Visit Diagnoses:  1. Primary osteoarthritis of both knees    Euflexxa #2 bilateral knee joint injections  Large Joint Inj: bilateral knee on 12/14/2022 8:45 AM Indications: pain Details: 27 G 1.5 in needle, medial approach  Arthrogram: No  Medications (Right): 1.5 mL lidocaine 1 %; 20 mg Sodium Hyaluronate (Viscosup) 20 MG/2ML Aspirate (Right): 0 mL Medications (Left): 1.5 mL lidocaine 1 %; 20 mg Sodium Hyaluronate (Viscosup) 20 MG/2ML Aspirate (Left): 0 mL Outcome: tolerated well, no immediate complications Procedure, treatment alternatives, risks and benefits explained, specific risks discussed. Consent was given by the patient. Immediately prior to procedure a time out was called to verify the correct patient, procedure, equipment, support staff and site/side marked as required. Patient was prepped and draped in the usual sterile fashion.     Patient tolerated the procedure well.  Aftercare was discussed.  Hazel Sams, PA-C

## 2022-12-14 NOTE — Telephone Encounter (Signed)
Next Visit: 12/21/2022  Last Visit: 12/07/2022  Last Fill: 09/11/2022  DX: Rheumatoid arthritis with rheumatoid factor of multiple sites without organ or systems involvement   Current Dose per office note 12/07/2022: Methotrexate 8 tablets by mouth once weekly   Labs: 11/08/2022 Glucose 115  Okay to refill Methotrexate?

## 2022-12-21 ENCOUNTER — Ambulatory Visit: Payer: Managed Care, Other (non HMO) | Attending: Physician Assistant | Admitting: Physician Assistant

## 2022-12-21 DIAGNOSIS — M170 Bilateral primary osteoarthritis of knee: Secondary | ICD-10-CM

## 2022-12-21 MED ORDER — LIDOCAINE HCL 1 % IJ SOLN
1.5000 mL | INTRAMUSCULAR | Status: AC | PRN
  Administered 2022-12-21: 1.5 mL

## 2022-12-21 NOTE — Progress Notes (Signed)
   Procedure Note  Patient: Laura Barrett             Date of Birth: 06-15-1966           MRN: [id number]             Visit Date: 12/21/2022  Procedures: Visit Diagnoses:  1. Primary osteoarthritis of both knees    Euflexxa #3 bilateral knee joint injections  Large Joint Inj: bilateral knee on 12/21/2022 2:16 PM Indications: pain Details: 27 G 1.5 in needle, medial approach  Arthrogram: No  Medications (Right): 1.5 mL lidocaine 1 % Aspirate (Right): 0 mL Medications (Left): 1.5 mL lidocaine 1 % Aspirate (Left): 0 mL Outcome: tolerated well, no immediate complications Procedure, treatment alternatives, risks and benefits explained, specific risks discussed. Consent was given by the patient. Immediately prior to procedure a time out was called to verify the correct patient, procedure, equipment, support staff and site/side marked as required. Patient was prepped and draped in the usual sterile fashion.     Patient tolerated the procedure well.  Aftercare was discussed.  Hazel Sams, PA-C

## 2023-02-02 ENCOUNTER — Other Ambulatory Visit: Payer: Self-pay | Admitting: *Deleted

## 2023-02-02 DIAGNOSIS — Z79899 Other long term (current) drug therapy: Secondary | ICD-10-CM

## 2023-02-03 LAB — CBC WITH DIFFERENTIAL/PLATELET
Absolute Monocytes: 874 cells/uL (ref 200–950)
Basophils Absolute: 38 cells/uL (ref 0–200)
Basophils Relative: 0.4 %
Eosinophils Absolute: 240 cells/uL (ref 15–500)
Eosinophils Relative: 2.5 %
HCT: 42.1 % (ref 35.0–45.0)
Hemoglobin: 14.2 g/dL (ref 11.7–15.5)
Lymphs Abs: 2371 cells/uL (ref 850–3900)
MCH: 30.3 pg (ref 27.0–33.0)
MCHC: 33.7 g/dL (ref 32.0–36.0)
MCV: 89.8 fL (ref 80.0–100.0)
MPV: 11.6 fL (ref 7.5–12.5)
Monocytes Relative: 9.1 %
Neutro Abs: 6077 cells/uL (ref 1500–7800)
Neutrophils Relative %: 63.3 %
Platelets: 260 10*3/uL (ref 140–400)
RBC: 4.69 10*6/uL (ref 3.80–5.10)
RDW: 12.8 % (ref 11.0–15.0)
Total Lymphocyte: 24.7 %
WBC: 9.6 10*3/uL (ref 3.8–10.8)

## 2023-02-03 LAB — COMPLETE METABOLIC PANEL WITH GFR
AG Ratio: 1.8 (calc) (ref 1.0–2.5)
ALT: 16 U/L (ref 6–29)
AST: 17 U/L (ref 10–35)
Albumin: 3.9 g/dL (ref 3.6–5.1)
Alkaline phosphatase (APISO): 82 U/L (ref 37–153)
BUN: 13 mg/dL (ref 7–25)
CO2: 23 mmol/L (ref 20–32)
Calcium: 8.7 mg/dL (ref 8.6–10.4)
Chloride: 107 mmol/L (ref 98–110)
Creat: 0.65 mg/dL (ref 0.50–1.03)
Globulin: 2.2 g/dL (calc) (ref 1.9–3.7)
Glucose, Bld: 131 mg/dL — ABNORMAL HIGH (ref 65–99)
Potassium: 4.3 mmol/L (ref 3.5–5.3)
Sodium: 139 mmol/L (ref 135–146)
Total Bilirubin: 0.3 mg/dL (ref 0.2–1.2)
Total Protein: 6.1 g/dL (ref 6.1–8.1)
eGFR: 104 mL/min/{1.73_m2} (ref >=60)

## 2023-02-05 NOTE — Progress Notes (Signed)
Glucose is 131. Rest of CMP WNL.  CBC WNL.

## 2023-03-06 ENCOUNTER — Other Ambulatory Visit: Payer: Self-pay | Admitting: Physician Assistant

## 2023-03-07 NOTE — Telephone Encounter (Signed)
Last Fill: 12/14/2022  Labs: 02/02/2023 Glucose is 131. Rest of CMP WNL.  CBC WNL.   Next Visit: 05/11/2023  Last Visit: 12/07/2022  DX: Rheumatoid arthritis with rheumatoid factor of multiple sites without organ or systems involvement   Current Dose per office note on 12/07/2022: Methotrexate 8 tablets by mouth once weekly   Okay to refill Methotrexate?

## 2023-04-27 NOTE — Progress Notes (Signed)
Office Visit Note  Patient: Laura Barrett             Date of Birth: 06/26/1966           MRN: [id number]             PCP: Krystal Clark, NP Referring: Rhea Bleacher* Visit Date: 05/11/2023 Occupation: @GUAROCC @  Subjective:  Medication management  History of Present Illness: Dehlia Fordice is a 57 y.o. female with seropositive rheumatoid arthritis, osteoarthritis and degenerative disc disease.  She states she has not had a flare of rheumatoid arthritis in a while.  She has been taking methotrexate 8 tablets weekly along with folic acid.  She states she was doing really well until she went on a trip to [place].  After a long drive of more than 13 hours she started having pain and stiffness in her neck upper back and lower back.  She is still gradually recovering from it.  She is having significant morning stiffness lasting for about couple of hours and nocturnal pain.  She was going to physical therapy where she was getting dry needling and exercises which was very helpful.  She will resume physical therapy.  She has noticed improvement in her lower extremity muscle strength and balance.  Right lateral epicondylitis has improved.  She has intermittent discomfort. She still has discomfort in her knee joints.  She states after the trip she started having pain in her left hip which radiates down to the lateral side of her thigh.   Activities of Daily Living:  Patient reports morning stiffness for 2 hours.   Patient Reports nocturnal pain.  Difficulty dressing/grooming: Denies Difficulty climbing stairs: Reports Difficulty getting out of chair: Reports Difficulty using hands for taps, buttons, cutlery, and/or writing: Denies  Review of Systems  Constitutional:  Positive for fatigue.  HENT:  Positive for mouth dryness. Negative for mouth sores.   Eyes:  Negative for dryness.  Respiratory:  Negative for shortness of breath and difficulty breathing.    Cardiovascular:  Negative for chest pain and palpitations.  Gastrointestinal:  Negative for blood in stool and diarrhea.  Endocrine: Negative for increased urination.  Genitourinary:  Negative for decreased urine output.  Musculoskeletal:  Positive for joint pain, joint pain and morning stiffness.  Skin:  Negative for color change, rash and sensitivity to sunlight.  Allergic/Immunologic: Negative for susceptible to infections.  Neurological:  Negative for headaches.  Hematological:  Negative for swollen glands.  Psychiatric/Behavioral:  Positive for sleep disturbance. Negative for depressed mood. The patient is not nervous/anxious.     PMFS History:  Patient Active Problem List   Diagnosis Date Noted   Urge incontinence 04/08/2021   Primary insomnia 04/08/2021   Prediabetes 04/08/2021   Acquired hypothyroidism 04/08/2021   History of diverticulosis 04/08/2021   Essential hypertension 04/08/2021   Primary osteoarthritis of both knees 04/08/2021   High risk medication use 04/08/2021    Past Medical History:  Diagnosis Date   Osteoarthritis    Rheumatoid arthritis (HCC)     Family History  Problem Relation Age of Onset   Hypertension Mother    Stroke Mother    Hypertension Father    Rheum arthritis Sister    Past Surgical History:  Procedure Laterality Date   BOWEL RESECTION  08/2012   CHOLECYSTECTOMY  01/2005   ILEOSTOMY     & reversal   Social History   Social History Narrative   Not on file   Immunization History  Administered Date(s) Administered   PFIZER(Purple Top)SARS-COV-2 Vaccination 12/29/2019, 01/26/2020, 06/25/2020, 02/04/2021     Objective: Vital Signs: BP 130/83 (BP Location: Left Arm, Patient Position: Sitting, Cuff Size: Large)   Pulse 64   Resp 16   Ht 5' 1.5" (1.562 m)   Wt 220 lb (99.8 kg)   LMP  (LMP Unknown)   BMI 40.90 kg/m    Physical Exam Vitals and nursing note reviewed.  Constitutional:      Appearance: She is well-developed.   HENT:     Head: Normocephalic and atraumatic.  Eyes:     Conjunctiva/sclera: Conjunctivae normal.  Cardiovascular:     Rate and Rhythm: Normal rate and regular rhythm.     Heart sounds: Normal heart sounds.  Pulmonary:     Effort: Pulmonary effort is normal.     Breath sounds: Normal breath sounds.  Abdominal:     General: Bowel sounds are normal.     Palpations: Abdomen is soft.  Musculoskeletal:     Cervical back: Normal range of motion.  Lymphadenopathy:     Cervical: No cervical adenopathy.  Skin:    General: Skin is warm and dry.     Capillary Refill: Capillary refill takes less than 2 seconds.  Neurological:     Mental Status: She is alert and oriented to person, place, and time.  Psychiatric:        Behavior: Behavior normal.      Musculoskeletal Exam: Cervical spine was in good range of motion.  Thoracic kyphosis was noted.  She has some tenderness over SI joints.  She had discomfort on abduction of her left shoulder and tenderness over left subacromial bursa.  Right shoulder joint was in full range of motion without discomfort.  Elbow joints, wrist joints, MCPs PIPs and DIPs been good range of motion with no synovitis.  Hip joints and knee joints in good range of motion.  She had tenderness over left trochanteric bursa.  There was no tenderness over ankles or MTPs.  CDAI Exam: CDAI Score: -- Patient Global: 10 / 100; Provider Global: 10 / 100 Swollen: --; Tender: -- Joint Exam 05/11/2023   No joint exam has been documented for this visit   There is currently no information documented on the homunculus. Go to the Rheumatology activity and complete the homunculus joint exam.  Investigation: No additional findings.  Imaging: No results found.  Recent Labs: Lab Results  Component Value Date   WBC 9.6 02/02/2023   HGB 14.2 02/02/2023   PLT 260 02/02/2023   NA 139 02/02/2023   K 4.3 02/02/2023   CL 107 02/02/2023   CO2 23 02/02/2023   GLUCOSE 131 (H)  02/02/2023   BUN 13 02/02/2023   CREATININE 0.65 02/02/2023   BILITOT 0.3 02/02/2023   AST 17 02/02/2023   ALT 16 02/02/2023   PROT 6.1 02/02/2023   CALCIUM 8.7 02/02/2023   QFTBGOLDPLUS NEGATIVE 04/08/2021     Speciality Comments: Humira x 34months-discontinued 03/22 due to inadequate response.  Schedule appointment every 3 months for compliance  Procedures:  No procedures performed Allergies: Ramipril   Assessment / Plan:     Visit Diagnoses: Rheumatoid arthritis with rheumatoid factor of multiple sites without organ or systems involvement (HCC)-patient denies having a rheumatoid arthritis flare.  She had no synovitis on the examination.  She has been taking methotrexate 8 tablets weekly without any interruption.  She took a recent trip to [place] and drove for about 13 hours.  She has been  having aches and pains in different parts of her body without any joint swelling.  High risk medication use - Methotrexate 8 tablets by mouth once weekly and folic acid 2 mg daily.  Patient had labs done at PCPs office on May 09, 2023 CBC normal, CMP normal LDL 86 she was advised to get repeat labs in October and every 3 months to monitor for drug toxicity.  Information regarding immunization was placed in the AVS.  She was advised to hold methotrexate if she develops an infection resume until infection resolves.  Acute pain of left shoulder-she has been doing discomfort in her left shoulder.  She had tenderness over subacromial region.  She has been doing some water exercises which will be helpful.  Lateral epicondylitis, right elbow -she gives history of intermittent pain.  She had no tenderness on the examination today.  Trochanteric bursitis, left hip-she has been having increased discomfort over the left trochanteric region since the recent trip to [place].  A handout on IT band stretches was given.  Primary osteoarthritis of both knees - s/p euflexxa bilateral knees 12/2022.  She had good  response to viscosupplement injections.  Lower extremity muscle strength exercises were discussed.  Primary osteoarthritis of both feet-proper fitting shoes were advised.  DDD (degenerative disc disease), cervical -she continues to have some stiffness in her neck.  X-rays of the C-spine were consistent with significant narrowing between C5-C6 and C6-C7.  She has been going to physical therapy and getting dry needling.  She states she has had improvement in her neck and back discomfort.  Essential hypertension-blood pressure was normal at 130/83 today.  Other medical problems are listed as follows:  Prediabetes  Urge incontinence  Acquired hypothyroidism  History of diverticulosis  Primary insomnia  Family history of rheumatoid arthritis  Orders: No orders of the defined types were placed in this encounter.  No orders of the defined types were placed in this encounter.    Follow-Up Instructions: Return for Rheumatoid arthritis, Osteoarthritis.   Pollyann Savoy, MD  Note - This record has been created using Animal nutritionist.  Chart creation errors have been sought, but may not always  have been located. Such creation errors do not reflect on  the standard of medical care.

## 2023-05-11 ENCOUNTER — Encounter: Payer: Self-pay | Admitting: Rheumatology

## 2023-05-11 ENCOUNTER — Ambulatory Visit: Payer: Managed Care, Other (non HMO) | Attending: Rheumatology | Admitting: Rheumatology

## 2023-05-11 VITALS — BP 130/83 | HR 64 | Resp 16 | Ht 61.5 in | Wt 220.0 lb

## 2023-05-11 DIAGNOSIS — Z8719 Personal history of other diseases of the digestive system: Secondary | ICD-10-CM

## 2023-05-11 DIAGNOSIS — M5030 Other cervical disc degeneration, unspecified cervical region: Secondary | ICD-10-CM

## 2023-05-11 DIAGNOSIS — Z79899 Other long term (current) drug therapy: Secondary | ICD-10-CM

## 2023-05-11 DIAGNOSIS — M25512 Pain in left shoulder: Secondary | ICD-10-CM

## 2023-05-11 DIAGNOSIS — N3941 Urge incontinence: Secondary | ICD-10-CM

## 2023-05-11 DIAGNOSIS — M170 Bilateral primary osteoarthritis of knee: Secondary | ICD-10-CM

## 2023-05-11 DIAGNOSIS — M19071 Primary osteoarthritis, right ankle and foot: Secondary | ICD-10-CM

## 2023-05-11 DIAGNOSIS — M19072 Primary osteoarthritis, left ankle and foot: Secondary | ICD-10-CM

## 2023-05-11 DIAGNOSIS — I10 Essential (primary) hypertension: Secondary | ICD-10-CM

## 2023-05-11 DIAGNOSIS — M7711 Lateral epicondylitis, right elbow: Secondary | ICD-10-CM

## 2023-05-11 DIAGNOSIS — R7303 Prediabetes: Secondary | ICD-10-CM

## 2023-05-11 DIAGNOSIS — F5101 Primary insomnia: Secondary | ICD-10-CM

## 2023-05-11 DIAGNOSIS — M0579 Rheumatoid arthritis with rheumatoid factor of multiple sites without organ or systems involvement: Secondary | ICD-10-CM | POA: Diagnosis not present

## 2023-05-11 DIAGNOSIS — M7062 Trochanteric bursitis, left hip: Secondary | ICD-10-CM

## 2023-05-11 DIAGNOSIS — E039 Hypothyroidism, unspecified: Secondary | ICD-10-CM

## 2023-05-11 DIAGNOSIS — Z8261 Family history of arthritis: Secondary | ICD-10-CM

## 2023-05-11 NOTE — Patient Instructions (Signed)
Iliotibial Band Syndrome Rehab Ask your health care provider which exercises are safe for you. Do exercises exactly as told by your provider and adjust them as told. It's normal to feel mild stretching, pulling, tightness, or discomfort as you do these exercises. Stop right away if you feel sudden pain or your pain gets a lot worse. Do not begin these exercises until told by your provider. Stretching and range-of-motion exercises These exercises warm up your muscles and joints. They also improve the movement and flexibility of your hip and pelvis. Quadriceps stretch, prone  Lie face down (prone) on a firm surface like a bed or padded floor. Bend your left / right knee. Reach back to hold your ankle or pant leg. If you can't reach your ankle or pant leg, use a belt looped around your foot and grab the belt instead. Gently pull your heel toward your butt. Your knee should not slide out to the side. You should feel a stretch in the front of your thigh and knee, also called the quadriceps. Hold this position for __________ seconds. Repeat __________ times. Complete this exercise __________ times a day. Iliotibial band stretch The iliotibial band is a strip of tissue that runs along the outside of your hip down to your knee. Lie on your side with your left / right leg on top. Bend both knees and grab your left / right ankle. Stretch out your bottom arm to help you balance. Slowly bring your top knee back so your thigh goes behind your back. Slowly lower your top leg toward the floor until you feel a gentle stretch on the outside of your left / right hip and thigh. If you don't feel a stretch and your knee won't go farther, place the heel of your other foot on top of your knee and pull your knee down toward the floor with your foot. Hold this position for __________ seconds. Repeat __________ times. Complete this exercise __________ times a day. Strengthening exercises These exercises build strength  and endurance in your hip and pelvis. Endurance means your muscles can keep working even when they're tired. Straight leg raises, side-lying This exercise strengthens the muscles that rotate the leg at the hip and move it away from your body. These muscles are called hip abductors. Lie on your side with your left / right leg on top. Lie so your head, shoulder, hip, and knee line up. You can bend your bottom knee to help you balance. Roll your hips slightly forward so they're stacked directly over each other. Your left / right knee should face forward. Tense the muscles in your outer thigh and hip. Lift your top leg 4-6 inches (10-15 cm) off the ground. Hold this position for __________ seconds. Slowly lower your leg back down to the starting position. Let your muscles fully relax before doing this exercise again. Repeat __________ times. Complete this exercise __________ times a day. Leg raises, prone This exercise strengthens the muscles that move the hips backward. These muscles are called hip extensors. Lie face down (prone) on your bed or a firm surface. You can put a pillow under your hips for comfort and to support your lower back. Bend your left / right knee so your foot points straight up toward the ceiling. Keep the other leg straight and behind you. Squeeze your butt muscles. Lift your left / right thigh off the firm surface. Do not let your back arch. Tense your thigh muscle as hard as you can without having  more knee pain. Hold this position for __________ seconds. Slowly lower your leg to the starting position. Allow your leg to relax all the way. Repeat __________ times. Complete this exercise __________ times a day. Hip hike  Stand sideways on a bottom step. Place your feet so that your left / right leg is on the step, and the other foot is hanging off the side. If you need support for balance, hold onto a railing or wall. Keep your knees straight and your abdomen square,  meaning your hips are level. Then, lift your left / right hip up toward the ceiling. Slowly let your leg that's hanging off the step lower towards the floor. Your foot should get closer to the ground. Do not lean or bend your knees during this movement. Repeat __________ times. Complete this exercise __________ times a day. This information is not intended to replace advice given to you by your health care provider. Make sure you discuss any questions you have with your health care provider. Document Revised: 12/08/2022 Document Reviewed: 12/08/2022 Elsevier Patient Education  2024 [employer]   Standing Labs We placed an order today for your standing lab work.   Please have your standing labs drawn in October and every 3 months  Please have your labs drawn 2 weeks prior to your appointment so that the provider can discuss your lab results at your appointment, if possible.  Please note that you may see your imaging and lab results in MyChart before we have reviewed them. We will contact you once all results are reviewed. Please allow our office up to 72 hours to thoroughly review all of the results before contacting the office for clarification of your results.  WALK-IN LAB HOURS  Monday through Thursday from 8:00 am -12:30 pm and 1:00 pm-5:00 pm and Friday from 8:00 am-12:00 pm.  Patients with office visits requiring labs will be seen before walk-in labs.  You may encounter longer than normal wait times. Please allow additional time. Wait times may be shorter on  Monday and Thursday afternoons.  We do not book appointments for walk-in labs. We appreciate your patience and understanding with our staff.   Labs are drawn by [facility]. Please bring your co-pay at the time of your lab draw.  You may receive a bill from [facility] for your lab work.  Please note if you are on Hydroxychloroquine and and an order has been placed for a Hydroxychloroquine level,  you will need to have it drawn 4 hours  or more after your last dose.  If you wish to have your labs drawn at another location, please call the office 24 hours in advance so we can fax the orders.  The office is located at [address], [address], [place], [place] [address]   If you have any questions regarding directions or hours of operation,  please call [phone].   As a reminder, please drink plenty of water prior to coming for your lab work. Thanks!   Vaccines You are taking a medication(s) that can suppress your immune system.  The following immunizations are recommended: Flu annually Covid-19  RSV Td/Tdap (tetanus, diphtheria, pertussis) every 10 years Pneumonia (Prevnar 15 then Pneumovax 23 at least 1 year apart.  Alternatively, can take Prevnar 20 without needing additional dose) Shingrix: 2 doses from 4 weeks to 6 months apart  Please check with your PCP to make sure you are up to date.   If you have signs or symptoms of an infection  or start antibiotics: First, call your PCP for workup of your infection. Hold your medication through the infection, until you complete your antibiotics, and until symptoms resolve if you take the following: Injectable medication (Actemra, Benlysta, Cimzia, Cosentyx, Enbrel, Humira, Kevzara, Orencia, Remicade, Simponi, Stelara, Taltz, Tremfya) Methotrexate Leflunomide (Arava) Mycophenolate (Cellcept) Harriette Ohara, Olumiant, or Rinvoq

## 2023-06-04 ENCOUNTER — Other Ambulatory Visit: Payer: Self-pay | Admitting: Rheumatology

## 2023-06-04 NOTE — Telephone Encounter (Signed)
Last Fill: 03/07/2023  Labs: 05/09/2023 Glucose 134,   Next Visit: 10/12/2023  Last Visit: 05/11/2023  DX: Rheumatoid arthritis with rheumatoid factor of multiple sites without organ or systems involvement   Current Dose per office note 05/11/2023: Methotrexate 8 tablets by mouth once weekly   Okay to refill Methotrexate?

## 2023-06-22 DIAGNOSIS — M5416 Radiculopathy, lumbar region: Secondary | ICD-10-CM | POA: Insufficient documentation

## 2023-07-05 ENCOUNTER — Other Ambulatory Visit: Payer: Self-pay | Admitting: Physician Assistant

## 2023-07-05 NOTE — Telephone Encounter (Signed)
Last Fill: 05/29/2022  Next Visit: 10/12/2023  Last Visit: 05/11/2023  Dx: Rheumatoid arthritis with rheumatoid factor of multiple sites without organ or systems involvement   Current Dose per office note on 05/11/2023: folic acid 2 mg daily   Okay to refill Folic Acid?

## 2023-07-17 DIAGNOSIS — M51362 Other intervertebral disc degeneration, lumbar region with discogenic back pain and lower extremity pain: Secondary | ICD-10-CM | POA: Insufficient documentation

## 2023-08-21 ENCOUNTER — Other Ambulatory Visit: Payer: Self-pay

## 2023-08-21 DIAGNOSIS — Z79899 Other long term (current) drug therapy: Secondary | ICD-10-CM

## 2023-08-22 ENCOUNTER — Other Ambulatory Visit: Payer: Self-pay | Admitting: *Deleted

## 2023-08-22 DIAGNOSIS — Z79899 Other long term (current) drug therapy: Secondary | ICD-10-CM

## 2023-08-23 LAB — CBC WITH DIFFERENTIAL/PLATELET
Absolute Lymphocytes: 2322 {cells}/uL (ref 850–3900)
Absolute Monocytes: 800 {cells}/uL (ref 200–950)
Basophils Absolute: 52 {cells}/uL (ref 0–200)
Basophils Relative: 0.6 %
Eosinophils Absolute: 206 {cells}/uL (ref 15–500)
Eosinophils Relative: 2.4 %
HCT: 44.9 % (ref 35.0–45.0)
Hemoglobin: 14.8 g/dL (ref 11.7–15.5)
MCH: 30.2 pg (ref 27.0–33.0)
MCHC: 33 g/dL (ref 32.0–36.0)
MCV: 91.6 fL (ref 80.0–100.0)
MPV: 11.9 fL (ref 7.5–12.5)
Monocytes Relative: 9.3 %
Neutro Abs: 5220 {cells}/uL (ref 1500–7800)
Neutrophils Relative %: 60.7 %
Platelets: 261 10*3/uL (ref 140–400)
RBC: 4.9 10*6/uL (ref 3.80–5.10)
RDW: 13 % (ref 11.0–15.0)
Total Lymphocyte: 27 %
WBC: 8.6 10*3/uL (ref 3.8–10.8)

## 2023-08-23 LAB — COMPLETE METABOLIC PANEL WITH GFR
AG Ratio: 1.6 (calc) (ref 1.0–2.5)
ALT: 24 U/L (ref 6–29)
AST: 18 U/L (ref 10–35)
Albumin: 3.9 g/dL (ref 3.6–5.1)
Alkaline phosphatase (APISO): 88 U/L (ref 37–153)
BUN: 12 mg/dL (ref 7–25)
CO2: 27 mmol/L (ref 20–32)
Calcium: 9.2 mg/dL (ref 8.6–10.4)
Chloride: 105 mmol/L (ref 98–110)
Creat: 0.73 mg/dL (ref 0.50–1.03)
Globulin: 2.5 g/dL (ref 1.9–3.7)
Glucose, Bld: 121 mg/dL — ABNORMAL HIGH (ref 65–99)
Potassium: 5 mmol/L (ref 3.5–5.3)
Sodium: 139 mmol/L (ref 135–146)
Total Bilirubin: 0.5 mg/dL (ref 0.2–1.2)
Total Protein: 6.4 g/dL (ref 6.1–8.1)
eGFR: 96 mL/min/{1.73_m2} (ref >=60)

## 2023-08-23 NOTE — Progress Notes (Signed)
CMP shows mildly elevated glucose, probably not a fasting sample.

## 2023-09-03 ENCOUNTER — Other Ambulatory Visit: Payer: Self-pay | Admitting: Physician Assistant

## 2023-09-03 NOTE — Telephone Encounter (Signed)
Last Fill: 06/04/2023  Labs: 08/22/2023 CMP shows mildly elevated glucose, probably not a fasting sample. CBC WNL   Next Visit: 10/12/2023  Last Visit: 05/11/2023  DX: Rheumatoid arthritis with rheumatoid factor of multiple sites without organ or systems involvement   Current Dose per office note 05/11/2023: Methotrexate 8 tablets by mouth once weekly   Okay to refill Methotrexate?

## 2023-09-20 DIAGNOSIS — M47816 Spondylosis without myelopathy or radiculopathy, lumbar region: Secondary | ICD-10-CM | POA: Insufficient documentation

## 2023-09-28 NOTE — Progress Notes (Signed)
 Office Visit Note  Patient: Laura Barrett             Date of Birth: 1966/06/06           MRN: [id number]             PCP: Benson Eleanor Rung, NP Referring: Benson Eleanor PARAS* Visit Date: 10/12/2023 Occupation: @GUAROCC @  Subjective:  Chronic lower back pain  History of Present Illness: Laura Barrett is a 58 y.o. female with history of seropositive rheumatoid arthritis.  Patient remains on methotrexate  8 tablets by mouth once weekly and folic acid  2 mg daily.  She is tolerating methotrexate  without any side effects.  She has not had any recent gaps in therapy.  She has been experiencing intermittent discomfort in her right thumb especially with repetitive and overuse activities cooking around the holidays.  Overall her rheumatoid arthritis has been well-controlled on the current treatment regimen.  Patient states that her knee joint pain is also been manageable since undergoing viscosupplementation at the end of February/early March 2024.  She would like to reapply for viscosupplementation for both knees.  Patient reports that she has been under the care of a back specialist and recently had a facet joint injection in mid December 2024.  The injection was successful so she is planning to proceed with a second facet joint injection and eventually will proceed with a spinal ablation if approved by insurance.  She has noticed an improvement in her lower back pain since starting physical therapy as well.  Activities of Daily Living:  Patient reports morning stiffness for 2 hours.   Patient Reports nocturnal pain.  Difficulty dressing/grooming: Denies Difficulty climbing stairs: Reports Difficulty getting out of chair: Reports Difficulty using hands for taps, buttons, cutlery, and/or writing: Denies  Review of Systems  Constitutional:  Positive for fatigue.  HENT:  Positive for mouth dryness. Negative for mouth sores.   Eyes:  Positive for dryness.  Respiratory:  Negative  for shortness of breath.   Cardiovascular:  Negative for chest pain and palpitations.  Gastrointestinal:  Negative for blood in stool, constipation and diarrhea.  Endocrine: Negative for increased urination.  Genitourinary:  Positive for involuntary urination.  Musculoskeletal:  Positive for joint pain, joint pain, joint swelling, myalgias, muscle weakness, morning stiffness, muscle tenderness and myalgias. Negative for gait problem.  Skin:  Positive for hair loss and sensitivity to sunlight. Negative for color change and rash.  Allergic/Immunologic: Negative for susceptible to infections.  Neurological:  Negative for dizziness and headaches.  Hematological:  Negative for swollen glands.  Psychiatric/Behavioral:  Positive for sleep disturbance. Negative for depressed mood. The patient is nervous/anxious.     PMFS History:  Patient Active Problem List   Diagnosis Date Noted   Urge incontinence 04/08/2021   Primary insomnia 04/08/2021   Prediabetes 04/08/2021   Acquired hypothyroidism 04/08/2021   History of diverticulosis 04/08/2021   Essential hypertension 04/08/2021   Primary osteoarthritis of both knees 04/08/2021   High risk medication use 04/08/2021    Past Medical History:  Diagnosis Date   Osteoarthritis    Rheumatoid arthritis (HCC)     Family History  Problem Relation Age of Onset   Hypertension Mother    Stroke Mother    Hypertension Father    Rheum arthritis Sister    Past Surgical History:  Procedure Laterality Date   BOWEL RESECTION  08/2012   CHOLECYSTECTOMY  01/2005   ILEOSTOMY     & reversal   Social History  Social History Narrative   Not on file   Immunization History  Administered Date(s) Administered   PFIZER(Purple Top)SARS-COV-2 Vaccination 12/29/2019, 01/26/2020, 06/25/2020, 02/04/2021     Objective: Vital Signs: BP 133/82 (BP Location: Left Arm, Patient Position: Sitting, Cuff Size: Normal)   Pulse 66   Resp 16   Ht 5' 1.5 (1.562 m)    Wt 221 lb (100.2 kg)   LMP  (LMP Unknown)   BMI 41.08 kg/m    Physical Exam Vitals and nursing note reviewed.  Constitutional:      Appearance: She is well-developed.  HENT:     Head: Normocephalic and atraumatic.  Eyes:     Conjunctiva/sclera: Conjunctivae normal.  Cardiovascular:     Rate and Rhythm: Normal rate and regular rhythm.     Heart sounds: Normal heart sounds.  Pulmonary:     Effort: Pulmonary effort is normal.     Breath sounds: Normal breath sounds.  Abdominal:     General: Bowel sounds are normal.     Palpations: Abdomen is soft.  Musculoskeletal:     Cervical back: Normal range of motion.  Lymphadenopathy:     Cervical: No cervical adenopathy.  Skin:    General: Skin is warm and dry.     Capillary Refill: Capillary refill takes less than 2 seconds.  Neurological:     Mental Status: She is alert and oriented to person, place, and time.  Psychiatric:        Behavior: Behavior normal.      Musculoskeletal Exam: C-spine has good range of motion with no discomfort.  Midline spinal tenderness in the lumbar region.  Shoulder joints, elbow joints, wrist joints, MCPs, PIPs, DIPs have good range of motion with no synovitis.  Tenderness of the right first MCP joint noted.  Complete fist formation bilaterally.  Hip joints have good range of motion with no groin pain.  Knee joints have good range of motion no warmth or effusion.  Ankle joints have good range of motion with no tenderness or joint swelling.  CDAI Exam: CDAI Score: -- Patient Global: 10 / 100; Provider Global: 10 / 100 Swollen: --; Tender: -- Joint Exam 10/12/2023   No joint exam has been documented for this visit   There is currently no information documented on the homunculus. Go to the Rheumatology activity and complete the homunculus joint exam.  Investigation: No additional findings.  Imaging: No results found.  Recent Labs: Lab Results  Component Value Date   WBC 8.6 08/22/2023   HGB  14.8 08/22/2023   PLT 261 08/22/2023   NA 139 08/22/2023   K 5.0 08/22/2023   CL 105 08/22/2023   CO2 27 08/22/2023   GLUCOSE 121 (H) 08/22/2023   BUN 12 08/22/2023   CREATININE 0.73 08/22/2023   BILITOT 0.5 08/22/2023   AST 18 08/22/2023   ALT 24 08/22/2023   PROT 6.4 08/22/2023   CALCIUM 9.2 08/22/2023   QFTBGOLDPLUS NEGATIVE 04/08/2021    Speciality Comments: Humira x 13months-discontinued 03/22 due to inadequate response.  Schedule appointment every 3 months for compliance  Procedures:  No procedures performed Allergies: Ramipril   Assessment / Plan:     Visit Diagnoses: Rheumatoid arthritis with rheumatoid factor of multiple sites without organ or systems involvement [facility]): She has no synovitis on examination today.  She has not had any signs or symptoms of a rheumatoid arthritis flare.  She has clinically been doing well taking methotrexate  8 tablets by mouth once weekly and folic acid   2 mg daily.  She continues to tolerate methotrexate  without any side effects and has not had any recent gaps in therapy.  No recent or recurrent infections.  She is having some soreness in the right first MCP joint but no synovitis was noted.  She will remain on methotrexate  as prescribed.  She was advised to notify us  if she develops signs or symptoms of a flare.  She will follow-up in the office in 5 months or sooner if needed.  High risk medication use - Methotrexate  8 tablets by mouth once weekly and folic acid  2 mg daily. CBC and CMP updated on 08/22/23. Her next lab work will be due in February and every 3 months.  Standing orders for CBC and CMP remain in place. No recurrent infections.  Discussed the importance of holding methotrexate  if she develops signs or symptoms of an infection and to resume once the infection has completely cleared.    Lateral epicondylitis, right elbow: Not currently symptomatic.  Trochanteric bursitis, left hip: Not currently symptomatic.  Primary  osteoarthritis of both knees - s/p euflexxa bilateral knees 12/2022-she previously had a significant improvement in her symptoms after undergoing viscosupplementation for both knees.  She has good range of motion of both knee joints on examination today.  No warmth or effusion noted.  Patient would like to reapply for viscosupplementation for both knees.  This patient is diagnosed with osteoarthritis of the knee(s).    Radiographs show evidence of joint space narrowing, osteophytes, subchondral sclerosis and/or subchondral cysts.  This patient has knee pain which interferes with functional and activities of daily living.    This patient has experienced inadequate response, adverse effects and/or intolerance with conservative treatments such as acetaminophen, NSAIDS, topical creams, physical therapy or regular exercise, knee bracing and/or weight loss.   This patient has experienced inadequate response or has a contraindication to intra articular steroid injections for at least 3 months.   This patient is not scheduled to have a total knee replacement within 6 months of starting treatment with viscosupplementation.  Primary osteoarthritis of both feet: She is not experiencing any increased discomfort in her feet at this time.  She has good range of motion of both ankle joints with no tenderness or synovitis.  DDD (degenerative disc disease), cervical - X-rays of the C-spine were consistent with significant narrowing between C5-C6 and C6-C7.  Completed physical therapy.  Her range of motion has improved significantly.  C-spine has good range of motion on examination today with no discomfort.  No symptoms of radiculopathy.  Chronic midline low back pain without sciatica: Patient had an MRI of the lumbar spine on 07/13/2023.  She underwent a facet joint injection in mid-December, which provided significant relief and improved her mobility.  She is planning to proceed with a second facet joint injection and  eventually would like to have an ablation performed.  She has been going to physical therapy which has also been helpful--she will be restarting PT soon.  Other medical conditions are listed as follows:  Essential hypertension: Blood pressure was 133/82 today in the office.  Prediabetes  Urge incontinence  Acquired hypothyroidism  History of diverticulosis  Primary insomnia  Family history of rheumatoid arthritis  Orders: No orders of the defined types were placed in this encounter.  No orders of the defined types were placed in this encounter.    Follow-Up Instructions: Return in about 5 months (around 03/11/2024) for Rheumatoid arthritis.   Waddell CHRISTELLA Craze, PA-C  Note -  This record has been created using [employer].  Chart creation errors have been sought, but may not always  have been located. Such creation errors do not reflect on  the standard of medical care.

## 2023-10-12 ENCOUNTER — Ambulatory Visit: Payer: Managed Care, Other (non HMO) | Attending: Physician Assistant | Admitting: Physician Assistant

## 2023-10-12 ENCOUNTER — Encounter: Payer: Self-pay | Admitting: Physician Assistant

## 2023-10-12 ENCOUNTER — Telehealth: Payer: Self-pay | Admitting: *Deleted

## 2023-10-12 VITALS — BP 133/82 | HR 66 | Resp 16 | Ht 61.5 in | Wt 221.0 lb

## 2023-10-12 DIAGNOSIS — F5101 Primary insomnia: Secondary | ICD-10-CM

## 2023-10-12 DIAGNOSIS — R7303 Prediabetes: Secondary | ICD-10-CM

## 2023-10-12 DIAGNOSIS — M19072 Primary osteoarthritis, left ankle and foot: Secondary | ICD-10-CM

## 2023-10-12 DIAGNOSIS — Z8719 Personal history of other diseases of the digestive system: Secondary | ICD-10-CM

## 2023-10-12 DIAGNOSIS — E039 Hypothyroidism, unspecified: Secondary | ICD-10-CM

## 2023-10-12 DIAGNOSIS — M5030 Other cervical disc degeneration, unspecified cervical region: Secondary | ICD-10-CM

## 2023-10-12 DIAGNOSIS — I10 Essential (primary) hypertension: Secondary | ICD-10-CM

## 2023-10-12 DIAGNOSIS — Z79899 Other long term (current) drug therapy: Secondary | ICD-10-CM | POA: Diagnosis not present

## 2023-10-12 DIAGNOSIS — M170 Bilateral primary osteoarthritis of knee: Secondary | ICD-10-CM

## 2023-10-12 DIAGNOSIS — M19071 Primary osteoarthritis, right ankle and foot: Secondary | ICD-10-CM

## 2023-10-12 DIAGNOSIS — N3941 Urge incontinence: Secondary | ICD-10-CM

## 2023-10-12 DIAGNOSIS — M7711 Lateral epicondylitis, right elbow: Secondary | ICD-10-CM | POA: Diagnosis not present

## 2023-10-12 DIAGNOSIS — M7062 Trochanteric bursitis, left hip: Secondary | ICD-10-CM

## 2023-10-12 DIAGNOSIS — G8929 Other chronic pain: Secondary | ICD-10-CM

## 2023-10-12 DIAGNOSIS — M0579 Rheumatoid arthritis with rheumatoid factor of multiple sites without organ or systems involvement: Secondary | ICD-10-CM | POA: Diagnosis not present

## 2023-10-12 DIAGNOSIS — M545 Low back pain, unspecified: Secondary | ICD-10-CM

## 2023-10-12 DIAGNOSIS — M25512 Pain in left shoulder: Secondary | ICD-10-CM

## 2023-10-12 DIAGNOSIS — Z8261 Family history of arthritis: Secondary | ICD-10-CM

## 2023-10-12 NOTE — Telephone Encounter (Signed)
Please apply for visco bil knees. Thank you.

## 2023-10-12 NOTE — Patient Instructions (Signed)
 Standing Labs We placed an order today for your standing lab work.   Please have your standing labs drawn in mid-February and every 3 months   Please have your labs drawn 2 weeks prior to your appointment so that the provider can discuss your lab results at your appointment, if possible.  Please note that you may see your imaging and lab results in MyChart before we have reviewed them. We will contact you once all results are reviewed. Please allow our office up to 72 hours to thoroughly review all of the results before contacting the office for clarification of your results.  WALK-IN LAB HOURS  Monday through Thursday from 8:00 am -12:30 pm and 1:00 pm-5:00 pm and Friday from 8:00 am-12:00 pm.  Patients with office visits requiring labs will be seen before walk-in labs.  You may encounter longer than normal wait times. Please allow additional time. Wait times may be shorter on  Monday and Thursday afternoons.  We do not book appointments for walk-in labs. We appreciate your patience and understanding with our staff.   Labs are drawn by [facility]. Please bring your co-pay at the time of your lab draw.  You may receive a bill from [facility] for your lab work.  Please note if you are on Hydroxychloroquine and and an order has been placed for a Hydroxychloroquine level,  you will need to have it drawn 4 hours or more after your last dose.  If you wish to have your labs drawn at another location, please call the office 24 hours in advance so we can fax the orders.  The office is located at [address], [address], [place], [place] [address]   If you have any questions regarding directions or hours of operation,  please call [phone].   As a reminder, please drink plenty of water prior to coming for your lab work. Thanks!

## 2023-10-12 NOTE — Telephone Encounter (Signed)
 VOB submitted for Euflexxa, Bilateral knee(s) BV pending

## 2023-10-16 NOTE — Telephone Encounter (Signed)
Prior authorization required through patient's plan.  PA submitted and pending.

## 2023-10-30 ENCOUNTER — Other Ambulatory Visit: Payer: Self-pay | Admitting: Physician Assistant

## 2023-11-01 ENCOUNTER — Other Ambulatory Visit: Payer: Self-pay | Admitting: Physician Assistant

## 2023-11-02 ENCOUNTER — Telehealth: Payer: Self-pay | Admitting: *Deleted

## 2023-11-02 NOTE — Telephone Encounter (Signed)
Patient contacted the office to inquire why her prescription for MTX was declined.   Attempted to contact the patient and left message to advise patient her prescription was declined as we sent a 90 day supply in on 09/03/2023 and it is too early to refill. Advised patient to reach out to the pharmacy to see what the issue may be.

## 2023-11-20 NOTE — Telephone Encounter (Signed)
Please call to schedule visco injections.  Approved for Euflexxa, Bilateral knee(s). Buy & Bill $45 Co-pay Deductible does not apply If OOP is met $5000 (met $0) copay will be waived Authorization #[id number] Authorization dates:  11/07/2023 - 05/06/2024

## 2023-11-26 ENCOUNTER — Other Ambulatory Visit: Payer: Self-pay | Admitting: Physician Assistant

## 2023-11-26 NOTE — Telephone Encounter (Signed)
 Last Fill: 09/03/2023  Labs: 08/22/2023 CBC WNL CMP shows mildly elevated glucose, probably not a fasting sample.   Next Visit: 03/19/2024  Last Visit: 10/12/2023  DX:  Rheumatoid arthritis with rheumatoid factor of multiple sites without organ or systems involvement   Current Dose per office note 10/12/2023: Methotrexate 8 tablets by mouth once weekly   Patient will be doing lab work with her PCP this week and will have results sent to our office.   Okay to refill Methotrexate?

## 2023-12-02 DIAGNOSIS — R8290 Unspecified abnormal findings in urine: Secondary | ICD-10-CM | POA: Insufficient documentation

## 2023-12-18 NOTE — Progress Notes (Signed)
   Procedure Note  Patient: [name]             Date of Birth: 1966/06/27           MRN: [id number]             Visit Date: 01/01/2024  Procedures: Visit Diagnoses:   Euflexxa #2 bilateral knees, B/B 1. Primary osteoarthritis of both knees     Large Joint Inj: bilateral knee on 01/01/2024 1:24 PM Indications: pain Details: 27 G 1.5 in needle, medial approach  Arthrogram: No  Medications (Right): 1.5 mL lidocaine 1 %; 20 mg Sodium Hyaluronate (Viscosup) 20 MG/2ML Aspirate (Right): 0 mL Medications (Left): 1.5 mL lidocaine 1 %; 20 mg Sodium Hyaluronate (Viscosup) 20 MG/2ML Aspirate (Left): 0 mL Outcome: tolerated well, no immediate complications Procedure, treatment alternatives, risks and benefits explained, specific risks discussed. Consent was given by the patient. Immediately prior to procedure a time out was called to verify the correct patient, procedure, equipment, support staff and site/side marked as required.     Patient tolerated the procedures well. Aftercare was discussed.  Sherron Ales, PA-C

## 2023-12-25 ENCOUNTER — Ambulatory Visit: Payer: Managed Care, Other (non HMO) | Attending: Physician Assistant | Admitting: Physician Assistant

## 2023-12-25 DIAGNOSIS — M170 Bilateral primary osteoarthritis of knee: Secondary | ICD-10-CM | POA: Diagnosis not present

## 2023-12-25 MED ORDER — LIDOCAINE HCL 1 % IJ SOLN
1.5000 mL | INTRAMUSCULAR | Status: AC | PRN
  Administered 2023-12-25: 1.5 mL

## 2023-12-25 MED ORDER — METHOTREXATE SODIUM 2.5 MG PO TABS: ORAL_TABLET | ORAL | 0 refills | Status: DC

## 2023-12-25 MED ORDER — SODIUM HYALURONATE (VISCOSUP) 20 MG/2ML IX SOSY
20.0000 mg | PREFILLED_SYRINGE | INTRA_ARTICULAR | Status: AC | PRN
  Administered 2023-12-25: 20 mg via INTRA_ARTICULAR

## 2023-12-25 NOTE — Progress Notes (Signed)
   Procedure Note  Patient: Laura Barrett             Date of Birth: [birth year]           MRN: [id number]             Visit Date: 12/25/2023  Procedures: Visit Diagnoses:  1. Primary osteoarthritis of both knees     Euflexxa #1 bilateral knees, B/B Large Joint Inj: bilateral knee on 12/25/2023 12:55 PM Indications: pain Details: 27 G 1.5 in needle, medial approach  Arthrogram: No  Medications (Right): 1.5 mL lidocaine 1 %; 20 mg Sodium Hyaluronate (Viscosup) 20 MG/2ML Aspirate (Right): 0 mL Medications (Left): 1.5 mL lidocaine 1 %; 20 mg Sodium Hyaluronate (Viscosup) 20 MG/2ML Aspirate (Left): 0 mL Outcome: tolerated well, no immediate complications Procedure, treatment alternatives, risks and benefits explained, specific risks discussed. Consent was given by the patient. Immediately prior to procedure a time out was called to verify the correct patient, procedure, equipment, support staff and site/side marked as required.     Patient requested a refill of methotrexate. Reviewed lab work from 11/29/23.   Patient tolerated the procedures well. Aftercare was discussed.  Sherron Ales, PA-C

## 2024-01-01 ENCOUNTER — Ambulatory Visit: Payer: Managed Care, Other (non HMO) | Attending: Physician Assistant | Admitting: Physician Assistant

## 2024-01-01 DIAGNOSIS — M0579 Rheumatoid arthritis with rheumatoid factor of multiple sites without organ or systems involvement: Secondary | ICD-10-CM

## 2024-01-01 DIAGNOSIS — Z8719 Personal history of other diseases of the digestive system: Secondary | ICD-10-CM

## 2024-01-01 DIAGNOSIS — N3941 Urge incontinence: Secondary | ICD-10-CM

## 2024-01-01 DIAGNOSIS — M170 Bilateral primary osteoarthritis of knee: Secondary | ICD-10-CM

## 2024-01-01 DIAGNOSIS — Z8261 Family history of arthritis: Secondary | ICD-10-CM

## 2024-01-01 DIAGNOSIS — E039 Hypothyroidism, unspecified: Secondary | ICD-10-CM

## 2024-01-01 DIAGNOSIS — I10 Essential (primary) hypertension: Secondary | ICD-10-CM

## 2024-01-01 DIAGNOSIS — R7303 Prediabetes: Secondary | ICD-10-CM

## 2024-01-01 DIAGNOSIS — F5101 Primary insomnia: Secondary | ICD-10-CM

## 2024-01-01 DIAGNOSIS — M19071 Primary osteoarthritis, right ankle and foot: Secondary | ICD-10-CM

## 2024-01-01 DIAGNOSIS — Z79899 Other long term (current) drug therapy: Secondary | ICD-10-CM

## 2024-01-01 DIAGNOSIS — M7711 Lateral epicondylitis, right elbow: Secondary | ICD-10-CM

## 2024-01-01 DIAGNOSIS — M7062 Trochanteric bursitis, left hip: Secondary | ICD-10-CM

## 2024-01-01 DIAGNOSIS — M5030 Other cervical disc degeneration, unspecified cervical region: Secondary | ICD-10-CM

## 2024-01-01 MED ORDER — LIDOCAINE HCL 1 % IJ SOLN
1.5000 mL | INTRAMUSCULAR | Status: AC | PRN
  Administered 2024-01-01: 1.5 mL

## 2024-01-01 MED ORDER — SODIUM HYALURONATE (VISCOSUP) 20 MG/2ML IX SOSY
20.0000 mg | PREFILLED_SYRINGE | INTRA_ARTICULAR | Status: AC | PRN
  Administered 2024-01-01: 20 mg via INTRA_ARTICULAR

## 2024-01-08 ENCOUNTER — Ambulatory Visit: Payer: Managed Care, Other (non HMO) | Attending: Physician Assistant | Admitting: Physician Assistant

## 2024-01-08 DIAGNOSIS — M170 Bilateral primary osteoarthritis of knee: Secondary | ICD-10-CM | POA: Diagnosis not present

## 2024-01-08 MED ORDER — LIDOCAINE HCL 1 % IJ SOLN
1.5000 mL | INTRAMUSCULAR | Status: AC | PRN
  Administered 2024-01-08: 1.5 mL

## 2024-01-08 MED ORDER — SODIUM HYALURONATE (VISCOSUP) 20 MG/2ML IX SOSY
20.0000 mg | PREFILLED_SYRINGE | INTRA_ARTICULAR | Status: AC | PRN
  Administered 2024-01-08: 20 mg via INTRA_ARTICULAR

## 2024-01-08 NOTE — Progress Notes (Signed)
   Procedure Note  Patient: Laura Barrett             Date of Birth: [birth year]           MRN: [id number]             Visit Date: 01/08/2024  Procedures: Visit Diagnoses:  1. Primary osteoarthritis of both knees     Euflexxa #3 bilateral knees, B/B Large Joint Inj: bilateral knee on 01/08/2024 1:00 PM Indications: pain Details: 27 G 1.5 in needle, medial approach  Arthrogram: No  Medications (Right): 1.5 mL lidocaine 1 %; 20 mg Sodium Hyaluronate (Viscosup) 20 MG/2ML Aspirate (Right): 0 mL Medications (Left): 1.5 mL lidocaine 1 %; 20 mg Sodium Hyaluronate (Viscosup) 20 MG/2ML Aspirate (Left): 0 mL Outcome: tolerated well, no immediate complications Procedure, treatment alternatives, risks and benefits explained, specific risks discussed. Consent was given by the patient. Immediately prior to procedure a time out was called to verify the correct patient, procedure, equipment, support staff and site/side marked as required.    Patient tolerated the procedures well. Aftercare was discussed.  Sherron Ales, PA-C

## 2024-01-30 DIAGNOSIS — M533 Sacrococcygeal disorders, not elsewhere classified: Secondary | ICD-10-CM | POA: Insufficient documentation

## 2024-02-29 ENCOUNTER — Other Ambulatory Visit: Payer: Self-pay | Admitting: *Deleted

## 2024-02-29 DIAGNOSIS — Z79899 Other long term (current) drug therapy: Secondary | ICD-10-CM

## 2024-03-01 LAB — CBC WITH DIFFERENTIAL/PLATELET
Absolute Lymphocytes: 2331 {cells}/uL (ref 850–3900)
Absolute Monocytes: 893 {cells}/uL (ref 200–950)
Basophils Absolute: 38 {cells}/uL (ref 0–200)
Basophils Relative: 0.4 %
Eosinophils Absolute: 310 {cells}/uL (ref 15–500)
Eosinophils Relative: 3.3 %
HCT: 44 % (ref 35.0–45.0)
Hemoglobin: 14.7 g/dL (ref 11.7–15.5)
MCH: 30.7 pg (ref 27.0–33.0)
MCHC: 33.4 g/dL (ref 32.0–36.0)
MCV: 91.9 fL (ref 80.0–100.0)
MPV: 11.6 fL (ref 7.5–12.5)
Monocytes Relative: 9.5 %
Neutro Abs: 5828 {cells}/uL (ref 1500–7800)
Neutrophils Relative %: 62 %
Platelets: 250 10*3/uL (ref 140–400)
RBC: 4.79 10*6/uL (ref 3.80–5.10)
RDW: 13.2 % (ref 11.0–15.0)
Total Lymphocyte: 24.8 %
WBC: 9.4 10*3/uL (ref 3.8–10.8)

## 2024-03-01 LAB — COMPREHENSIVE METABOLIC PANEL WITH GFR
AG Ratio: 1.6 (calc) (ref 1.0–2.5)
ALT: 22 U/L (ref 6–29)
AST: 18 U/L (ref 10–35)
Albumin: 4.1 g/dL (ref 3.6–5.1)
Alkaline phosphatase (APISO): 83 U/L (ref 37–153)
BUN: 9 mg/dL (ref 7–25)
CO2: 24 mmol/L (ref 20–32)
Calcium: 8.9 mg/dL (ref 8.6–10.4)
Chloride: 106 mmol/L (ref 98–110)
Creat: 0.64 mg/dL (ref 0.50–1.03)
Globulin: 2.6 g/dL (ref 1.9–3.7)
Glucose, Bld: 135 mg/dL — ABNORMAL HIGH (ref 65–99)
Potassium: 4.5 mmol/L (ref 3.5–5.3)
Sodium: 139 mmol/L (ref 135–146)
Total Bilirubin: 0.4 mg/dL (ref 0.2–1.2)
Total Protein: 6.7 g/dL (ref 6.1–8.1)
eGFR: 104 mL/min/{1.73_m2} (ref >=60)

## 2024-03-02 ENCOUNTER — Ambulatory Visit: Payer: Self-pay | Admitting: Rheumatology

## 2024-03-02 NOTE — Progress Notes (Signed)
CBC and CMP are normal.  Glucose is elevated, probably not a fasting sample.

## 2024-03-04 ENCOUNTER — Other Ambulatory Visit: Payer: Self-pay | Admitting: *Deleted

## 2024-03-04 MED ORDER — METHOTREXATE SODIUM 2.5 MG PO TABS
ORAL_TABLET | ORAL | 0 refills | Status: DC
Start: 2024-03-04 — End: 2024-06-10

## 2024-03-04 NOTE — Telephone Encounter (Signed)
 Last Fill: 12/25/2023  Labs: 02/29/2024  CBC and CMP are normal.  Glucose is elevated, probably not a fasting sample.   Next Visit: 03/19/2024  Last Visit: 10/12/2023  DX: Rheumatoid arthritis with rheumatoid factor of multiple sites without organ or systems involvement   Current Dose per office note : 10/12/2023 Methotrexate  8 tablets by mouth once weekly   Okay to refill Methotrexate ?

## 2024-03-06 NOTE — Progress Notes (Signed)
 Office Visit Note  Patient: Laura Barrett             Date of Birth: [birth year]           MRN: [id number]             PCP: Despina Floro, NP Referring: Edra Govern* Visit Date: 03/19/2024 Occupation: @GUAROCC @  Subjective:  Lower back pain  History of Present Illness: Laura Barrett is a 58 y.o. female with seropositive rheumatoid arthritis, osteoarthritis and degenerative disc disease.  She states she has been having increased lower back pain.  She had nerve ablation for the lumbar spine and had ongoing pain and discomfort in her SI joints.  She had left SI joint injection about 7 weeks ago.  She had relief from that injection.  She had another MRI this morning and the results are pending.  She has not had any flares of rheumatoid arthritis.  She continues to be on methotrexate  8 tablets weekly along with folic acid  2 mg daily without any interruption.  She has occasional discomfort in her right thumb which she describes over the [facility] joint.  Knee joints are doing much better after the Euflexxa injections in April 2025.  She also has an appointment coming up with Dr. Charol Copas for the evaluation of left hip pain.    Activities of Daily Living:  Patient reports morning stiffness for 2 hours.   Patient Reports nocturnal pain.  Difficulty dressing/grooming: Denies Difficulty climbing stairs: Reports Difficulty getting out of chair: Reports Difficulty using hands for taps, buttons, cutlery, and/or writing: Denies  Review of Systems  Constitutional:  Positive for fatigue.  HENT:  Positive for mouth dryness. Negative for mouth sores.   Eyes: Negative.  Negative for dryness.  Respiratory: Negative.  Negative for shortness of breath.   Cardiovascular: Negative.  Negative for chest pain and palpitations.  Gastrointestinal: Negative.  Negative for blood in stool, constipation and diarrhea.  Endocrine: Negative.  Negative for increased urination.  Genitourinary:  Negative.  Negative for involuntary urination.  Musculoskeletal:  Positive for joint pain, joint pain, joint swelling, muscle weakness, morning stiffness and muscle tenderness. Negative for gait problem, myalgias and myalgias.  Skin: Negative.  Negative for color change, rash, hair loss and sensitivity to sunlight.  Allergic/Immunologic: Negative for susceptible to infections.  Neurological: Negative.  Negative for dizziness and headaches.  Hematological: Negative.  Negative for swollen glands.  Psychiatric/Behavioral:  Positive for depressed mood and sleep disturbance. The patient is nervous/anxious.     PMFS History:  Patient Active Problem List   Diagnosis Date Noted   Bilateral hand swelling 03/19/2024   Long term current use of therapeutic drug 03/19/2024   Chronic fatigue syndrome 03/19/2024   Polyarthralgia 03/19/2024   Other chronic pain 03/19/2024   Primary osteoarthritis 03/19/2024   Sacroiliac joint pain 01/30/2024   Abnormal urine 12/02/2023   Lumbar spondylosis 09/20/2023   Degeneration of intervertebral disc of lumbar region with discogenic back pain and lower extremity pain 07/17/2023   Lumbar radiculopathy 06/22/2023   Degenerative disc disease, cervical 11/08/2022   Menopausal disorder 04/13/2022   Cervical lymphadenopathy 10/13/2021   Globus sensation 10/13/2021   History of COVID-19 10/11/2021   Urge incontinence 04/08/2021   Primary insomnia 04/08/2021   Prediabetes 04/08/2021   Acquired hypothyroidism 04/08/2021   History of diverticulosis 04/08/2021   Essential hypertension 04/08/2021   Primary osteoarthritis of both knees 04/08/2021   High risk medication use 04/08/2021   Encounter for screening  colonoscopy 03/10/2021   Body mass index (BMI) 40.0-44.9, adult (HCC) 01/17/2018   Hemorrhoids 01/17/2018   Primary osteoarthritis of right knee 09/21/2016   Diverticulosis 02/15/2016   Leukocytosis 02/15/2016   Malaise and fatigue 02/15/2016   Morbid obesity  (HCC) 02/15/2016   RA (rheumatoid arthritis) (HCC) 02/15/2016    Past Medical History:  Diagnosis Date   Osteoarthritis    Rheumatoid arthritis (HCC)     Family History  Problem Relation Age of Onset   Hypertension Mother    Stroke Mother    Hypertension Father    Rheum arthritis Sister    Past Surgical History:  Procedure Laterality Date   BOWEL RESECTION  08/2012   CHOLECYSTECTOMY  01/2005   ILEOSTOMY     & reversal   Social History   Social History Narrative   Not on file   Immunization History  Administered Date(s) Administered   Influenza, Seasonal, Injecte, Preservative Fre 11/29/2023   Influenza,inj,Quad PF,6+ Mos 09/09/2020, 11/09/2022   Influenza-Unspecified 08/08/2012, 09/23/2014   PFIZER Comirnaty(Gray Top)Covid-19 Tri-Sucrose Vaccine 02/04/2021   PFIZER(Purple Top)SARS-COV-2 Vaccination 12/29/2019, 01/26/2020, 06/25/2020, 02/04/2021   Tetanus 03/10/2006   Unspecified SARS-COV-2 Vaccination 12/29/2019, 01/26/2020, 06/25/2020, 01/28/2021     Objective: Vital Signs: BP 138/83   Pulse 64   Resp 16   Ht 5' 1.5 (1.562 m)   Wt 214 lb 6.4 oz (97.3 kg)   LMP  (LMP Unknown)   SpO2 96%   BMI 39.85 kg/m    Physical Exam Vitals and nursing note reviewed.  Constitutional:      Appearance: She is well-developed.  HENT:     Head: Normocephalic and atraumatic.  Eyes:     Conjunctiva/sclera: Conjunctivae normal.  Cardiovascular:     Rate and Rhythm: Normal rate and regular rhythm.     Heart sounds: Normal heart sounds.  Pulmonary:     Effort: Pulmonary effort is normal.     Breath sounds: Normal breath sounds.  Abdominal:     General: Bowel sounds are normal.     Palpations: Abdomen is soft.  Musculoskeletal:     Cervical back: Normal range of motion.  Lymphadenopathy:     Cervical: No cervical adenopathy.  Skin:    General: Skin is warm and dry.     Capillary Refill: Capillary refill takes less than 2 seconds.  Neurological:     Mental Status: She  is alert and oriented to person, place, and time.  Psychiatric:        Behavior: Behavior normal.      Musculoskeletal Exam: Cervical spine with good range of motion.  She has some discomfort range of motion of the lumbar spine.  There is no SI joint tenderness.  Shoulders, elbows, wrist joints, MCPs PIPs and DIPs with good range of motion.  She had no synovitis on examination today.  Hip joints and knee joints in good range of motion without any warmth swelling or effusion.  There was no tenderness over ankles or MTPs.  CDAI Exam: CDAI Score: -- Patient Global: --; Provider Global: -- Swollen: --; Tender: -- Joint Exam 03/19/2024   No joint exam has been documented for this visit   There is currently no information documented on the homunculus. Go to the Rheumatology activity and complete the homunculus joint exam.  Investigation: No additional findings.  Imaging: No results found.  Recent Labs: Lab Results  Component Value Date   WBC 9.4 02/29/2024   HGB 14.7 02/29/2024   PLT 250 02/29/2024  NA 139 02/29/2024   K 4.5 02/29/2024   CL 106 02/29/2024   CO2 24 02/29/2024   GLUCOSE 135 (H) 02/29/2024   BUN 9 02/29/2024   CREATININE 0.64 02/29/2024   BILITOT 0.4 02/29/2024   AST 18 02/29/2024   ALT 22 02/29/2024   PROT 6.7 02/29/2024   CALCIUM 8.9 02/29/2024   QFTBGOLDPLUS NEGATIVE 04/08/2021    Speciality Comments: Humira x 56months-discontinued 03/22 due to inadequate response.  Schedule appointment every 3 months for compliance  Procedures:  No procedures performed Allergies: Ramipril   Assessment / Plan:     Visit Diagnoses: Rheumatoid arthritis with rheumatoid factor of multiple sites without organ or systems involvement (HCC)-patient denies having a flare of rheumatoid arthritis since the last visit.  No synovitis was noted on the examination.  She has been taking methotrexate  8 tablets p.o. weekly with folic acid  without any interruption.  She denies any side  effects from methotrexate .  High risk medication use - Methotrexate  8 tablets by mouth once weekly and folic acid  2 mg daily.  Feb 29, 2024 CBC and CMP were normal.  She was advised to get labs every 3 months to monitor for drug toxicity.  Information on immunization was placed in the AVS.  She was advised to hold methotrexate  if she develops an infection resume after the infection resolves.  Lateral epicondylitis, right elbow-  Trochanteric bursitis, left hip-she gets intermittent symptoms.  She was not tender on the examination today.  Primary osteoarthritis of both knees - s/p euflexxa bilateral knees 12/2023-01/2024.  She had good response to Euflexxa injections and had significant relief in her knee joint discomfort.  Primary osteoarthritis of both feet-proper fitting shoes were advised.  DDD (degenerative disc disease), cervical -she had good motion without any discomfort.  X-rays of the C-spine were consistent with significant narrowing between C5-C6 and C6-C7.  Chronic midline low back pain without sciatica -she has been seeing back specialist.  She states she recently had left SI joint injection which gave her significant relief.  MRI of the lumbar spine on 07/13/2023.  She underwent a facet joint injection in mid-December, which provided significant relief and improved her mobility.  Patient states she had repeat MRI and the results are pending.  Essential hypertension-blood pressure was 138/83.  The medical problems are listed as follows:  Urge incontinence  Prediabetes  Acquired hypothyroidism  Primary insomnia  History of diverticulosis  Family history of rheumatoid arthritis  Orders: No orders of the defined types were placed in this encounter.  No orders of the defined types were placed in this encounter.    Follow-Up Instructions: Return for Rheumatoid arthritis, Osteoarthritis.   Nicholas Bari, MD  Note - This record has been created using Animal nutritionist.   Chart creation errors have been sought, but may not always  have been located. Such creation errors do not reflect on  the standard of medical care.

## 2024-03-19 ENCOUNTER — Encounter: Payer: Self-pay | Admitting: Rheumatology

## 2024-03-19 ENCOUNTER — Ambulatory Visit: Payer: Managed Care, Other (non HMO) | Attending: Rheumatology | Admitting: Rheumatology

## 2024-03-19 VITALS — BP 138/83 | HR 64 | Resp 16 | Ht 61.5 in | Wt 214.4 lb

## 2024-03-19 DIAGNOSIS — M19072 Primary osteoarthritis, left ankle and foot: Secondary | ICD-10-CM

## 2024-03-19 DIAGNOSIS — M7062 Trochanteric bursitis, left hip: Secondary | ICD-10-CM | POA: Diagnosis not present

## 2024-03-19 DIAGNOSIS — Z8261 Family history of arthritis: Secondary | ICD-10-CM

## 2024-03-19 DIAGNOSIS — M2550 Pain in unspecified joint: Secondary | ICD-10-CM | POA: Insufficient documentation

## 2024-03-19 DIAGNOSIS — Z8719 Personal history of other diseases of the digestive system: Secondary | ICD-10-CM

## 2024-03-19 DIAGNOSIS — M1991 Primary osteoarthritis, unspecified site: Secondary | ICD-10-CM | POA: Insufficient documentation

## 2024-03-19 DIAGNOSIS — E039 Hypothyroidism, unspecified: Secondary | ICD-10-CM

## 2024-03-19 DIAGNOSIS — M19071 Primary osteoarthritis, right ankle and foot: Secondary | ICD-10-CM

## 2024-03-19 DIAGNOSIS — Z79899 Other long term (current) drug therapy: Secondary | ICD-10-CM | POA: Diagnosis not present

## 2024-03-19 DIAGNOSIS — G9332 Myalgic encephalomyelitis/chronic fatigue syndrome: Secondary | ICD-10-CM | POA: Insufficient documentation

## 2024-03-19 DIAGNOSIS — M7989 Other specified soft tissue disorders: Secondary | ICD-10-CM | POA: Insufficient documentation

## 2024-03-19 DIAGNOSIS — G8929 Other chronic pain: Secondary | ICD-10-CM

## 2024-03-19 DIAGNOSIS — M545 Low back pain, unspecified: Secondary | ICD-10-CM

## 2024-03-19 DIAGNOSIS — M170 Bilateral primary osteoarthritis of knee: Secondary | ICD-10-CM | POA: Diagnosis not present

## 2024-03-19 DIAGNOSIS — M7711 Lateral epicondylitis, right elbow: Secondary | ICD-10-CM

## 2024-03-19 DIAGNOSIS — M5030 Other cervical disc degeneration, unspecified cervical region: Secondary | ICD-10-CM

## 2024-03-19 DIAGNOSIS — N3941 Urge incontinence: Secondary | ICD-10-CM

## 2024-03-19 DIAGNOSIS — F5101 Primary insomnia: Secondary | ICD-10-CM

## 2024-03-19 DIAGNOSIS — M0579 Rheumatoid arthritis with rheumatoid factor of multiple sites without organ or systems involvement: Secondary | ICD-10-CM

## 2024-03-19 DIAGNOSIS — I10 Essential (primary) hypertension: Secondary | ICD-10-CM

## 2024-03-19 DIAGNOSIS — R7303 Prediabetes: Secondary | ICD-10-CM

## 2024-03-19 NOTE — Patient Instructions (Signed)
 Standing Labs We placed an order today for your standing lab work.   Please have your standing labs drawn in August and every 3 months  Please have your labs drawn 2 weeks prior to your appointment so that the provider can discuss your lab results at your appointment, if possible.  Please note that you may see your imaging and lab results in MyChart before we have reviewed them. We will contact you once all results are reviewed. Please allow our office up to 72 hours to thoroughly review all of the results before contacting the office for clarification of your results.  WALK-IN LAB HOURS  Monday through Thursday from 8:00 am -12:30 pm and 1:00 pm-4:00 pm and Friday from 8:00 am-12:00 pm.  Patients with office visits requiring labs will be seen before walk-in labs.  You may encounter longer than normal wait times. Please allow additional time. Wait times may be shorter on  Monday and Thursday afternoons.  We do not book appointments for walk-in labs. We appreciate your patience and understanding with our staff.   Labs are drawn by [facility]. Please bring your co-pay at the time of your lab draw.  You may receive a bill from [facility] for your lab work.  Please note if you are on Hydroxychloroquine and and an order has been placed for a Hydroxychloroquine level,  you will need to have it drawn 4 hours or more after your last dose.  If you wish to have your labs drawn at another location, please call the office 24 hours in advance so we can fax the orders.  The office is located at [address], [address], [place], [place] [address]   If you have any questions regarding directions or hours of operation,  please call [phone].   As a reminder, please drink plenty of water prior to coming for your lab work. Thanks!   Vaccines You are taking a medication(s) that can suppress your immune system.  The following immunizations are recommended: Flu annually Covid-19  Td/Tdap (tetanus,  diphtheria, pertussis) every 10 years Pneumonia (Prevnar 15 then Pneumovax 23 at least 1 year apart.  Alternatively, can take Prevnar 20 without needing additional dose) Shingrix: 2 doses from 4 weeks to 6 months apart  Please check with your PCP to make sure you are up to date.   If you have signs or symptoms of an infection or start antibiotics: First, call your PCP for workup of your infection. Hold your medication through the infection, until you complete your antibiotics, and until symptoms resolve if you take the following: Injectable medication (Actemra, Benlysta, Cimzia, Cosentyx, Enbrel, Humira, Kevzara, Orencia, Remicade, Simponi, Stelara, Taltz, Tremfya) Methotrexate  Leflunomide  (Arava ) Mycophenolate (Cellcept) Xeljanz, Olumiant, or Rinvoq

## 2024-04-29 ENCOUNTER — Telehealth: Payer: Self-pay

## 2024-04-29 NOTE — Telephone Encounter (Signed)
 Patient contacted the office and states she is going to have a dental procedure on 05/14/2024. Patient states they will be cutting away a piece of her gum. Patient inquires if she needs to stop her methotrexate  prior to the procedure. Please advise.

## 2024-04-29 NOTE — Telephone Encounter (Signed)
 Ok to hold methotrexate  for 1 week before if she is concerned for an infection and delayed healing.

## 2024-04-29 NOTE — Telephone Encounter (Signed)
 Attempted to contact the patient and left a message to call the office back.

## 2024-04-30 NOTE — Telephone Encounter (Signed)
 Patient advised Ok to hold methotrexate  for 1 week before if she is concerned for an infection and delayed healing. Patient verbalized understanding.

## 2024-06-10 ENCOUNTER — Other Ambulatory Visit: Payer: Self-pay | Admitting: Physician Assistant

## 2024-06-10 NOTE — Telephone Encounter (Signed)
 Last Fill: 03/04/2024  Labs: 02/29/2024 CBC and CMP are normal.  Glucose is elevated, probably not a fasting sample.   Next Visit: 08/04/2024  Last Visit: 03/19/2024  DX:  Rheumatoid arthritis with rheumatoid factor of multiple sites without organ or systems involvement (HCC)   Current Dose per office note 03/19/2024: Methotrexate  8 tablets by mouth once weekly   Contacted the patient and advised she is due to update labs. Patient states she will come into the office Thursday to have her labs updated. Laps are already in order review.   Okay to refill Methotrexate ?

## 2024-07-08 ENCOUNTER — Other Ambulatory Visit: Payer: Self-pay | Admitting: Physician Assistant

## 2024-07-09 NOTE — Telephone Encounter (Signed)
 Last Fill: 06/10/2024  Labs: 05/28/2024 Hematocrit 45.3 Glucose 134  Next Visit: 08/04/2024  Last Visit: 03/19/2024  DX: Rheumatoid arthritis with rheumatoid factor of multiple sites without organ or systems involvement (HCC)   Current Dose per office note 03/19/2024: Methotrexate  8 tablets by mouth once weekly   Okay to refill Methotrexate ?

## 2024-07-21 NOTE — Progress Notes (Unsigned)
 Office Visit Note  Patient: Laura Barrett             Date of Birth: Feb 08, 1966           MRN: [id number]             PCP: Benson Eleanor Rung, NP Referring: Benson Eleanor PARAS* Visit Date: 08/04/2024 Occupation: Data Unavailable  Subjective:  SI joint pain   History of Present Illness: Laura Barrett is a 58 y.o. female with history of rheumatoid arthritis.  Patient remains on  Methotrexate  8 tablets by mouth once weekly and folic acid  2 mg daily.  She is tolerating methotrexate  without any side effects and has not had any gaps in therapy.  She denies any signs or symptoms of a rheumatoid arthritis flare.  Patient states that she was diagnosed with COVID at the end of July 2025 and held methotrexate  for 2 weeks at that time.  She has otherwise not had any other gaps in therapy. She states her knee joint pain has been manageable.  She underwent viscosupplementation for both knees in March/April 2025.  She would like to reapply for viscosupplementation for both knees.  Patient continues to go to physical therapy once a week which has been helping to alleviate the discomfort in her lower back. She is currently awaiting approval for bilateral SI joint injections, which were effective in the past.  She states her neck pain has improved.  She has continued home exercises and TENS units daily.   Activities of Daily Living:  Patient reports morning stiffness for 1-2 hours.   Patient Reports nocturnal pain.  Difficulty dressing/grooming: Denies Difficulty climbing stairs: Reports Difficulty getting out of chair: Reports Difficulty using hands for taps, buttons, cutlery, and/or writing: Reports  Review of Systems  Constitutional:  Positive for fatigue.  HENT:  Positive for mouth dryness. Negative for mouth sores.   Eyes:  Negative for dryness.  Respiratory:  Negative for shortness of breath.   Cardiovascular:  Negative for chest pain and palpitations.  Gastrointestinal:   Negative for blood in stool, constipation and diarrhea.  Endocrine: Negative for increased urination.  Genitourinary:  Positive for involuntary urination.  Musculoskeletal:  Positive for joint pain, joint pain, muscle weakness, morning stiffness and muscle tenderness. Negative for gait problem, joint swelling, myalgias and myalgias.  Skin:  Positive for sensitivity to sunlight. Negative for color change, rash and hair loss.  Allergic/Immunologic: Negative for susceptible to infections.  Neurological:  Negative for dizziness and headaches.  Hematological:  Negative for swollen glands.  Psychiatric/Behavioral:  Positive for depressed mood and sleep disturbance. The patient is nervous/anxious.     PMFS History:  Patient Active Problem List   Diagnosis Date Noted   Bilateral hand swelling 03/19/2024   Long term current use of therapeutic drug 03/19/2024   Chronic fatigue syndrome 03/19/2024   Polyarthralgia 03/19/2024   Other chronic pain 03/19/2024   Primary osteoarthritis 03/19/2024   Sacroiliac joint pain 01/30/2024   Abnormal urine 12/02/2023   Lumbar spondylosis 09/20/2023   Degeneration of intervertebral disc of lumbar region with discogenic back pain and lower extremity pain 07/17/2023   Lumbar radiculopathy 06/22/2023   Degenerative disc disease, cervical 11/08/2022   Menopausal disorder 04/13/2022   Cervical lymphadenopathy 10/13/2021   Globus sensation 10/13/2021   History of COVID-19 10/11/2021   Urge incontinence 04/08/2021   Primary insomnia 04/08/2021   Prediabetes 04/08/2021   Acquired hypothyroidism 04/08/2021   History of diverticulosis 04/08/2021   Essential hypertension 04/08/2021  Primary osteoarthritis of both knees 04/08/2021   High risk medication use 04/08/2021   Encounter for screening colonoscopy 03/10/2021   Body mass index (BMI) 40.0-44.9, adult (HCC) 01/17/2018   Hemorrhoids 01/17/2018   Primary osteoarthritis of right knee 09/21/2016    Diverticulosis 02/15/2016   Leukocytosis 02/15/2016   Malaise and fatigue 02/15/2016   Morbid obesity (HCC) 02/15/2016   RA (rheumatoid arthritis) (HCC) 02/15/2016    Past Medical History:  Diagnosis Date   Osteoarthritis    Rheumatoid arthritis (HCC)     Family History  Problem Relation Age of Onset   Hypertension Mother    Stroke Mother    Hypertension Father    Rheum arthritis Sister    Past Surgical History:  Procedure Laterality Date   BOWEL RESECTION  08/2012   CHOLECYSTECTOMY  01/2005   ILEOSTOMY     & reversal   Social History   Tobacco Use   Smoking status: Never    Passive exposure: Past   Smokeless tobacco: Never  Vaping Use   Vaping status: Never Used  Substance Use Topics   Alcohol use: Yes    Comment: Rarely   Drug use: Never   Social History   Social History Narrative   Not on file     Immunization History  Administered Date(s) Administered   Influenza, Seasonal, Injecte, Preservative Fre 11/29/2023   Influenza,inj,Quad PF,6+ Mos 09/09/2020, 11/09/2022   Influenza-Unspecified 08/08/2012, 09/23/2014   PFIZER Comirnaty(Gray Top)Covid-19 Tri-Sucrose Vaccine 02/04/2021   PFIZER(Purple Top)SARS-COV-2 Vaccination 12/29/2019, 01/26/2020, 06/25/2020, 02/04/2021   Tetanus 03/10/2006   Unspecified SARS-COV-2 Vaccination 12/29/2019, 01/26/2020, 06/25/2020, 01/28/2021     Objective: Vital Signs: BP 131/83   Pulse 60   Temp 97.7 F (36.5 C)   Resp 14   Ht 5' 1 (1.549 m)   Wt 218 lb 9.6 oz (99.2 kg)   LMP  (LMP Unknown)   BMI 41.30 kg/m    Physical Exam Vitals and nursing note reviewed.  Constitutional:      Appearance: She is well-developed.  HENT:     Head: Normocephalic and atraumatic.  Eyes:     Conjunctiva/sclera: Conjunctivae normal.  Cardiovascular:     Rate and Rhythm: Normal rate and regular rhythm.     Heart sounds: Normal heart sounds.  Pulmonary:     Effort: Pulmonary effort is normal.     Breath sounds: Normal breath  sounds.  Abdominal:     General: Bowel sounds are normal.     Palpations: Abdomen is soft.  Musculoskeletal:     Cervical back: Normal range of motion.  Lymphadenopathy:     Cervical: No cervical adenopathy.  Skin:    General: Skin is warm and dry.     Capillary Refill: Capillary refill takes less than 2 seconds.  Neurological:     Mental Status: She is alert and oriented to person, place, and time.  Psychiatric:        Behavior: Behavior normal.      Musculoskeletal Exam: C-spine has limited range of motion with lateral rotation.  Left trapezius muscle tension and tenderness. Tenderness of both SI joints.  Shoulder joints, elbow joints, wrist joints, MCPs, PIPs, DIPs have good range of motion with no synovitis.  Complete fist formation bilaterally.  Hip joints have good range of motion with no groin pain.  Knee joints have good range of motion no warmth or effusion.  Ankle joints have good range of motion no tenderness or joint swelling.  No evidence of Achilles tendinitis  or plantar fasciitis.   CDAI Exam: CDAI Score: -- Patient Global: --; Provider Global: -- Swollen: --; Tender: -- Joint Exam 08/04/2024   No joint exam has been documented for this visit   There is currently no information documented on the homunculus. Go to the Rheumatology activity and complete the homunculus joint exam.  Investigation: No additional findings.  Imaging: No results found.  Recent Labs: Lab Results  Component Value Date   WBC 9.4 02/29/2024   HGB 14.7 02/29/2024   PLT 250 02/29/2024   NA 139 02/29/2024   K 4.5 02/29/2024   CL 106 02/29/2024   CO2 24 02/29/2024   GLUCOSE 135 (H) 02/29/2024   BUN 9 02/29/2024   CREATININE 0.64 02/29/2024   BILITOT 0.4 02/29/2024   AST 18 02/29/2024   ALT 22 02/29/2024   PROT 6.7 02/29/2024   CALCIUM 8.9 02/29/2024   QFTBGOLDPLUS NEGATIVE 04/08/2021    Speciality Comments: Humira x 27months-discontinued 03/22 due to inadequate  response.  Schedule appointment every 3 months for compliance  Procedures:  No procedures performed Allergies: Ramipril     Assessment / Plan:     Visit Diagnoses: Rheumatoid arthritis with rheumatoid factor of multiple sites without organ or systems involvement [facility]): She has no synovitis on examination today.  She has not had any signs or symptoms of a rheumatoid arthritis flare.  She has clinically been doing well taking methotrexate  8 tablets by mouth once weekly and folic acid  2 mg daily.  She is tolerating methotrexate  without any side effects and has not had any recent gaps in therapy.  No recurrent infections.  No medication changes will be made at this time.  She was advised to notify us  if she develops signs or symptoms of a flare.  She will follow up in 5 months or sooner if needed.   High risk medication use - Methotrexate  8 tablets by mouth once weekly and folic acid  2 mg daily CBC and CMP updated on 05/28/24.  Her next lab work will be due in November and every 3 months.     Lipid panel 11/29/23 No recurrent infections.  Discussed the importance of holding methotrexate  if she develops signs or symptoms of an infection and to resume once the infection has completely cleared.   Trochanteric bursitis, left hip: Intermittent discomfort.  Currently going to PT once weekly and doing home exercises.    Primary osteoarthritis of both knees - s/p euflexxa bilateral knees 12/2023-01/2024.  She had good response to Euflexxa injections and had significant relief.  She is good range of motion of both knee joints on examination today.  No warmth or effusion noted.  Plan to reapply for viscosupplementation for both knees.  This patient is diagnosed with osteoarthritis of the knee(s).    Radiographs show evidence of joint space narrowing, osteophytes, subchondral sclerosis and/or subchondral cysts.  This patient has knee pain which interferes with functional and activities of daily living.     This patient has experienced inadequate response, adverse effects and/or intolerance with conservative treatments such as acetaminophen, NSAIDS, topical creams, physical therapy or regular exercise, knee bracing and/or weight loss.   This patient has experienced inadequate response or has a contraindication to intra articular steroid injections for at least 3 months.   This patient is not scheduled to have a total knee replacement within 6 months of starting treatment with viscosupplementation.  Primary osteoarthritis of both feet: She has good range of motion of both ankle joints with no tenderness  or synovitis.  She is wearing proper fitting shoes.  DDD (degenerative disc disease), cervical - X-rays of the C-spine were consistent with significant narrowing between C5-C6 and C6-C7.  She has slightly limited lateral rotation to the left.  Left trapezius muscle tension noted. She uses a TENS unit, which is helpful.    Chronic midline low back pain without sciatica - MRI of the lumbar spine on 07/13/2023.  She underwent a facet joint injection in mid-December, which provided significant relief and improved her mobility.  Patient has had undergone an epidural injection in the past.  She continues to go to physical therapy once weekly.  She is also performing home exercises.  She continues to have chronic pain in both SI joints.  She is currently awaiting approval for bilateral SI joint cortisone injections.  Other medical conditions are listed as follows:   Essential hypertension: BP was 131/83 today in the office.    Urge incontinence  Prediabetes  Acquired hypothyroidism  Primary insomnia  History of diverticulosis  Family history of rheumatoid arthritis  Lateral epicondylitis, right elbow  Orders: No orders of the defined types were placed in this encounter.  No orders of the defined types were placed in this encounter.    Follow-Up Instructions: Return in about 5 months  (around 01/02/2025) for Rheumatoid arthritis.   Waddell CHRISTELLA Craze, PA-C  Note - This record has been created using Dragon software.  Chart creation errors have been sought, but may not always  have been located. Such creation errors do not reflect on  the standard of medical care.

## 2024-07-30 ENCOUNTER — Other Ambulatory Visit: Payer: Self-pay | Admitting: Rheumatology

## 2024-07-30 NOTE — Telephone Encounter (Signed)
 Last Fill: 07/05/2023  Next Visit: 08/04/2024  Last Visit: 03/09/2024  Dx: Rheumatoid arthritis with rheumatoid factor of multiple sites without organ or systems involvement   Current Dose per office note on 03/19/2024: folic acid  2 mg daily   Okay to refill Folic Acid ?

## 2024-08-04 ENCOUNTER — Telehealth: Payer: Self-pay

## 2024-08-04 ENCOUNTER — Ambulatory Visit: Attending: Physician Assistant | Admitting: Physician Assistant

## 2024-08-04 ENCOUNTER — Encounter: Payer: Self-pay | Admitting: Physician Assistant

## 2024-08-04 VITALS — BP 131/83 | HR 60 | Temp 97.7°F | Resp 14 | Ht 61.0 in | Wt 218.6 lb

## 2024-08-04 DIAGNOSIS — M19072 Primary osteoarthritis, left ankle and foot: Secondary | ICD-10-CM

## 2024-08-04 DIAGNOSIS — N3941 Urge incontinence: Secondary | ICD-10-CM

## 2024-08-04 DIAGNOSIS — M170 Bilateral primary osteoarthritis of knee: Secondary | ICD-10-CM

## 2024-08-04 DIAGNOSIS — I10 Essential (primary) hypertension: Secondary | ICD-10-CM

## 2024-08-04 DIAGNOSIS — Z8261 Family history of arthritis: Secondary | ICD-10-CM

## 2024-08-04 DIAGNOSIS — M7711 Lateral epicondylitis, right elbow: Secondary | ICD-10-CM

## 2024-08-04 DIAGNOSIS — M7062 Trochanteric bursitis, left hip: Secondary | ICD-10-CM

## 2024-08-04 DIAGNOSIS — M5030 Other cervical disc degeneration, unspecified cervical region: Secondary | ICD-10-CM

## 2024-08-04 DIAGNOSIS — Z79899 Other long term (current) drug therapy: Secondary | ICD-10-CM

## 2024-08-04 DIAGNOSIS — M545 Low back pain, unspecified: Secondary | ICD-10-CM

## 2024-08-04 DIAGNOSIS — F5101 Primary insomnia: Secondary | ICD-10-CM

## 2024-08-04 DIAGNOSIS — Z8719 Personal history of other diseases of the digestive system: Secondary | ICD-10-CM

## 2024-08-04 DIAGNOSIS — M0579 Rheumatoid arthritis with rheumatoid factor of multiple sites without organ or systems involvement: Secondary | ICD-10-CM | POA: Diagnosis not present

## 2024-08-04 DIAGNOSIS — G8929 Other chronic pain: Secondary | ICD-10-CM

## 2024-08-04 DIAGNOSIS — M19071 Primary osteoarthritis, right ankle and foot: Secondary | ICD-10-CM

## 2024-08-04 DIAGNOSIS — R7303 Prediabetes: Secondary | ICD-10-CM

## 2024-08-04 DIAGNOSIS — E039 Hypothyroidism, unspecified: Secondary | ICD-10-CM

## 2024-08-04 NOTE — Telephone Encounter (Signed)
 Per Waddell Craze, please apply for bilateral knee visco injections.

## 2024-08-04 NOTE — Telephone Encounter (Signed)
 Prior authorization required through patient's insurance PA submitted and pending

## 2024-08-04 NOTE — Telephone Encounter (Signed)
 VOB submitted for Euflexxa, Bilateral knee(s) BV pending

## 2024-08-19 NOTE — Telephone Encounter (Signed)
 Please call to schedule visco injections.  Approved for Euflexxa, Bilateral knee(s). Buy & Bill [id number] co-pay Drug and administration are covered at 100% of the allowable amount Deductibles do not apply  If out of pocket is met $5000 (met [phone]) copay will be waived Authorization #[id number] Authorization dates  08/11/2024 to 02/08/2025

## 2024-08-25 ENCOUNTER — Other Ambulatory Visit: Payer: Self-pay | Admitting: *Deleted

## 2024-08-25 DIAGNOSIS — Z79899 Other long term (current) drug therapy: Secondary | ICD-10-CM

## 2024-08-25 LAB — COMPREHENSIVE METABOLIC PANEL WITH GFR
AG Ratio: 1.7 (calc) (ref 1.0–2.5)
ALT: 20 U/L (ref 6–29)
AST: 21 U/L (ref 10–35)
Albumin: 4.2 g/dL (ref 3.6–5.1)
Alkaline phosphatase (APISO): 86 U/L (ref 37–153)
BUN: 12 mg/dL (ref 7–25)
CO2: 24 mmol/L (ref 20–32)
Calcium: 9.1 mg/dL (ref 8.6–10.4)
Chloride: 106 mmol/L (ref 98–110)
Creat: 0.76 mg/dL (ref 0.50–1.03)
Globulin: 2.5 g/dL (ref 1.9–3.7)
Glucose, Bld: 133 mg/dL — ABNORMAL HIGH (ref 65–99)
Potassium: 4.1 mmol/L (ref 3.5–5.3)
Sodium: 139 mmol/L (ref 135–146)
Total Bilirubin: 0.5 mg/dL (ref 0.2–1.2)
Total Protein: 6.7 g/dL (ref 6.1–8.1)
eGFR: 91 mL/min/1.73m2 (ref >=60)

## 2024-08-25 LAB — CBC WITH DIFFERENTIAL/PLATELET
Absolute Lymphocytes: 2203 {cells}/uL (ref 850–3900)
Absolute Monocytes: 994 {cells}/uL — ABNORMAL HIGH (ref 200–950)
Basophils Absolute: 54 {cells}/uL (ref 0–200)
Basophils Relative: 0.5 %
Eosinophils Absolute: 119 {cells}/uL (ref 15–500)
Eosinophils Relative: 1.1 %
HCT: 43.2 % (ref 35.0–45.0)
Hemoglobin: 14.5 g/dL (ref 11.7–15.5)
MCH: 30.7 pg (ref 27.0–33.0)
MCHC: 33.6 g/dL (ref 32.0–36.0)
MCV: 91.3 fL (ref 80.0–100.0)
MPV: 12 fL (ref 7.5–12.5)
Monocytes Relative: 9.2 %
Neutro Abs: 7430 {cells}/uL (ref 1500–7800)
Neutrophils Relative %: 68.8 %
Platelets: 236 Thousand/uL (ref 140–400)
RBC: 4.73 Million/uL (ref 3.80–5.10)
RDW: 13.3 % (ref 11.0–15.0)
Total Lymphocyte: 20.4 %
WBC: 10.8 Thousand/uL (ref 3.8–10.8)

## 2024-08-26 ENCOUNTER — Other Ambulatory Visit: Payer: Self-pay | Admitting: Physician Assistant

## 2024-08-26 ENCOUNTER — Ambulatory Visit: Payer: Self-pay | Admitting: Rheumatology

## 2024-08-26 DIAGNOSIS — M533 Sacrococcygeal disorders, not elsewhere classified: Secondary | ICD-10-CM

## 2024-08-26 NOTE — Progress Notes (Signed)
 CBC and CMP are stable, glucose is mildly elevated.

## 2024-09-11 ENCOUNTER — Inpatient Hospital Stay
Admission: RE | Admit: 2024-09-11 | Discharge: 2024-09-11 | Disposition: A | Source: Ambulatory Visit | Attending: Physician Assistant | Admitting: Physician Assistant

## 2024-09-11 ENCOUNTER — Ambulatory Visit
Admission: RE | Admit: 2024-09-11 | Discharge: 2024-09-11 | Disposition: A | Source: Ambulatory Visit | Attending: Physician Assistant | Admitting: Physician Assistant

## 2024-09-11 ENCOUNTER — Other Ambulatory Visit

## 2024-09-11 DIAGNOSIS — M533 Sacrococcygeal disorders, not elsewhere classified: Secondary | ICD-10-CM

## 2024-10-21 ENCOUNTER — Ambulatory Visit: Payer: Self-pay | Attending: Physician Assistant | Admitting: Physician Assistant

## 2024-10-21 DIAGNOSIS — M170 Bilateral primary osteoarthritis of knee: Secondary | ICD-10-CM | POA: Diagnosis not present

## 2024-10-21 MED ORDER — SODIUM HYALURONATE (VISCOSUP) 20 MG/2ML IX SOSY
20.0000 mg | PREFILLED_SYRINGE | INTRA_ARTICULAR | Status: AC | PRN
  Administered 2024-10-21: 20 mg via INTRA_ARTICULAR

## 2024-10-21 MED ORDER — LIDOCAINE HCL 1 % IJ SOLN
1.5000 mL | INTRAMUSCULAR | Status: AC | PRN
  Administered 2024-10-21: 1.5 mL

## 2024-10-21 NOTE — Progress Notes (Signed)
" ° °  Procedure Note  Patient: Laura Barrett             Date of Birth: May 03, 1966           MRN: [id number]             Visit Date: 10/21/2024  Procedures: Visit Diagnoses:  1. Primary osteoarthritis of both knees     #1 Euflexxa Bilateral Knees B/B  Large Joint Inj: bilateral knee on 10/21/2024 2:38 PM Indications: pain Details: 27 G 1.5 in needle, medial approach  Arthrogram: No  Medications (Right): 1.5 mL lidocaine  1 %; 20 mg Sodium Hyaluronate (Viscosup) 20 MG/2ML Aspirate (Right): 0 mL Medications (Left): 1.5 mL lidocaine  1 %; 20 mg Sodium Hyaluronate (Viscosup) 20 MG/2ML Aspirate (Left): 0 mL Outcome: tolerated well, no immediate complications Procedure, treatment alternatives, risks and benefits explained, specific risks discussed. Consent was given by the patient.     Patient tolerated that the procedures without any side effects or complications.  Aftercare was discussed. Waddell Craze, PA-C    "

## 2024-10-28 ENCOUNTER — Ambulatory Visit: Payer: Self-pay | Attending: Physician Assistant | Admitting: Physician Assistant

## 2024-10-28 DIAGNOSIS — M170 Bilateral primary osteoarthritis of knee: Secondary | ICD-10-CM | POA: Diagnosis not present

## 2024-10-28 MED ORDER — LIDOCAINE HCL 1 % IJ SOLN
1.5000 mL | INTRAMUSCULAR | Status: AC | PRN
  Administered 2024-10-28: 1.5 mL

## 2024-10-28 MED ORDER — SODIUM HYALURONATE (VISCOSUP) 20 MG/2ML IX SOSY
20.0000 mg | PREFILLED_SYRINGE | INTRA_ARTICULAR | Status: AC | PRN
  Administered 2024-10-28: 20 mg via INTRA_ARTICULAR

## 2024-10-28 NOTE — Progress Notes (Signed)
" ° °  Procedure Note  Patient: Laura Barrett             Date of Birth: August 12, 1966           MRN: [id number]             Visit Date: 10/28/2024  Procedures: Visit Diagnoses:  1. Primary osteoarthritis of both knees     #2 Euflexxa Bilateral Knees B/B  Large Joint Inj: bilateral knee on 10/28/2024 10:25 AM Indications: pain Details: 27 G 1.5 in needle, medial approach  Arthrogram: No  Medications (Right): 1.5 mL lidocaine  1 %; 20 mg Sodium Hyaluronate (Viscosup) 20 MG/2ML Aspirate (Right): 0 mL Medications (Left): 1.5 mL lidocaine  1 %; 20 mg Sodium Hyaluronate (Viscosup) 20 MG/2ML Aspirate (Left): 0 mL Outcome: tolerated well, no immediate complications Procedure, treatment alternatives, risks and benefits explained, specific risks discussed. Consent was given by the patient.      Patient tolerated the procedure well.  Aftercare was discussed.  Waddell Craze, PA-C   "

## 2024-11-04 ENCOUNTER — Institutional Professional Consult (permissible substitution) (INDEPENDENT_AMBULATORY_CARE_PROVIDER_SITE_OTHER): Admitting: Nurse Practitioner

## 2024-11-04 ENCOUNTER — Ambulatory Visit: Payer: Self-pay | Admitting: Physician Assistant

## 2024-11-04 DIAGNOSIS — M170 Bilateral primary osteoarthritis of knee: Secondary | ICD-10-CM | POA: Diagnosis not present

## 2024-11-04 MED ORDER — SODIUM HYALURONATE (VISCOSUP) 20 MG/2ML IX SOSY
20.0000 mg | PREFILLED_SYRINGE | INTRA_ARTICULAR | Status: AC | PRN
  Administered 2024-11-04: 20 mg via INTRA_ARTICULAR

## 2024-11-04 MED ORDER — LIDOCAINE HCL 1 % IJ SOLN
1.5000 mL | INTRAMUSCULAR | Status: AC | PRN
  Administered 2024-11-04: 1.5 mL

## 2024-11-04 NOTE — Progress Notes (Signed)
" ° °  Procedure Note  Patient: Laura Barrett             Date of Birth: 04-Dec-1965           MRN: [id number]             Visit Date: 11/04/2024  Procedures: Visit Diagnoses:  1. Primary osteoarthritis of both knees    #3 Euflexxa Bilateral Knees B/B  Large Joint Inj: bilateral knee on 11/04/2024 2:17 PM Indications: pain Details: 27 G 1.5 in needle, medial approach  Arthrogram: No  Medications (Right): 1.5 mL lidocaine  1 %; 20 mg Sodium Hyaluronate (Viscosup) 20 MG/2ML Aspirate (Right): 0 mL Medications (Left): 1.5 mL lidocaine  1 %; 20 mg Sodium Hyaluronate (Viscosup) 20 MG/2ML Aspirate (Left): 0 mL Outcome: tolerated well, no immediate complications Procedure, treatment alternatives, risks and benefits explained, specific risks discussed. Consent was given by the patient.     Patient tolerated the procedures well. Aftercare was discussed.  Waddell Craze, PA-C   "

## 2024-11-05 ENCOUNTER — Other Ambulatory Visit: Payer: Self-pay | Admitting: Physician Assistant

## 2024-11-05 NOTE — Telephone Encounter (Signed)
 Last Fill: 07/09/2024  Labs: 08/25/2024 CBC and CMP are stable, glucose is mildly elevated.   Next Visit: 01/07/2025  Last Visit: 08/04/2024  DX: Rheumatoid arthritis with rheumatoid factor of multiple sites without organ or systems involvement (HCC)   Current Dose per office note 08/04/2024: Methotrexate  8 tablets by mouth once weekly   Okay to refill Methotrexate ?

## 2024-11-17 ENCOUNTER — Institutional Professional Consult (permissible substitution) (INDEPENDENT_AMBULATORY_CARE_PROVIDER_SITE_OTHER): Admitting: Family Medicine

## 2025-01-07 ENCOUNTER — Ambulatory Visit: Payer: Self-pay | Admitting: Rheumatology
